# Patient Record
Sex: Male | Born: 1990 | Race: White | Hispanic: No | Marital: Married | Smoking: Current some day smoker
Health system: Southern US, Community
[De-identification: ages and names within clinical notes are randomized; demographics above are authoritative.]

## PROBLEM LIST (undated history)

## (undated) DIAGNOSIS — G8929 Other chronic pain: Secondary | ICD-10-CM

## (undated) DIAGNOSIS — I10 Essential (primary) hypertension: Secondary | ICD-10-CM

## (undated) DIAGNOSIS — M549 Dorsalgia, unspecified: Secondary | ICD-10-CM

## (undated) HISTORY — PX: WISDOM TOOTH EXTRACTION: SHX21

---
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
## 2015-08-30 ENCOUNTER — Encounter (HOSPITAL_COMMUNITY): Payer: Self-pay | Admitting: Emergency Medicine

## 2015-08-30 ENCOUNTER — Emergency Department (HOSPITAL_COMMUNITY)
Admission: EM | Admit: 2015-08-30 | Discharge: 2015-08-30 | Disposition: A | Discharge status: Home / Self Care | Payer: Self-pay | Attending: Emergency Medicine | Admitting: Emergency Medicine

## 2015-08-30 DIAGNOSIS — I10 Essential (primary) hypertension: Secondary | ICD-10-CM | POA: Insufficient documentation

## 2015-08-30 DIAGNOSIS — L723 Sebaceous cyst: Secondary | ICD-10-CM | POA: Insufficient documentation

## 2015-08-30 DIAGNOSIS — F17210 Nicotine dependence, cigarettes, uncomplicated: Secondary | ICD-10-CM | POA: Insufficient documentation

## 2015-08-30 HISTORY — DX: Essential (primary) hypertension: I10

## 2015-08-30 MED ORDER — DICLOFENAC SODIUM 75 MG PO TBEC: 75.0000 mg | DELAYED_RELEASE_TABLET | Freq: Two times a day (BID) | ORAL | Status: DC

## 2015-08-30 MED ORDER — CLINDAMYCIN HCL 150 MG PO CAPS: 150.0000 mg | ORAL_CAPSULE | Freq: Four times a day (QID) | ORAL | Status: DC

## 2015-08-30 MED ORDER — CLINDAMYCIN HCL 150 MG PO CAPS
300.0000 mg | ORAL_CAPSULE | Freq: Once | ORAL | Status: AC
  Administered 2015-08-30: 300 mg via ORAL
  Filled 2015-08-30: qty 2

## 2015-08-30 MED ORDER — SULFAMETHOXAZOLE-TRIMETHOPRIM 800-160 MG PO TABS: 1.0000 | ORAL_TABLET | Freq: Two times a day (BID) | ORAL | Status: AC

## 2015-08-30 MED ORDER — KETOROLAC TROMETHAMINE 10 MG PO TABS
10.0000 mg | ORAL_TABLET | Freq: Once | ORAL | Status: AC
  Administered 2015-08-30: 10 mg via ORAL
  Filled 2015-08-30: qty 1

## 2015-08-30 NOTE — Discharge Instructions (Signed)
Epidermal Cyst PLEASE SOAK THE CYST AREA IN WARM EPSOM SALT WATER FOR 10-15 MIN. USE BACTRIM AND DICLOFENAC 2 TIMES DAILY WITH FOOD. SEE DR DAVIS IN THE OFFICE FOR EVALUATION AND MANAGEMENT OF THE INFECTED CYST.                                                                                                                                                                                                   An epidermal cyst is usually a small, painless lump under the skin. Cysts often occur on the face, neck, stomach, chest, or genitals. The cyst may be filled with a bad smelling paste. Do not pop your cyst. Popping the cyst can cause pain and puffiness (swelling). HOME CARE   Only take medicines as told by your doctor.  Take your medicine (antibiotics) as told. Finish it even if you start to feel better. GET HELP RIGHT AWAY IF:  Your cyst is tender, red, or puffy.  You are not getting better, or you are getting worse.  You have any questions or concerns. MAKE SURE YOU:  Understand these instructions.  Will watch your condition.  Will get help right away if you are not doing well or get worse.   This information is not intended to replace advice given to you by your health care provider. Make sure you discuss any questions you have with your health care provider.   Document Released: 05/10/2004 Document Revised: 10/02/2011 Document Reviewed: 10/09/2010 Elsevier Interactive Patient Education [employer]2016 Elsevier Inc.

## 2015-08-30 NOTE — ED Notes (Signed)
Onset yesterday abscess to back of neck, red, painful, swollen

## 2015-08-30 NOTE — ED Provider Notes (Signed)
CSN: [id number]     Arrival date & time 08/30/15  1254 History   First MD Initiated Contact with Patient 08/30/15 1306     Chief Complaint  Patient presents with  . Abscess     (Consider location/radiation/quality/duration/timing/severity/associated sxs/prior Treatment) Patient is a 25 y.o. male presenting with abscess. The history is provided by the patient.  Abscess Location:  Head/neck Head/neck abscess location: MID NECK. Abscess quality: painful, redness and warmth   Abscess quality: not draining and no fluctuance   Red streaking: no   Duration:  2 days Progression:  Worsening Pain details:    Quality:  Sharp   Severity:  Moderate   Timing:  Intermittent   Progression:  Worsening Chronicity:  New Context: not diabetes and not immunosuppression   Relieved by:  Nothing Exacerbated by: palpation. Ineffective treatments:  None tried Associated symptoms: no anorexia, no fever, no nausea and no vomiting   Risk factors: no hx of MRSA and no prior abscess     Past Medical History  Diagnosis Date  . Hypertension    History reviewed. No pertinent past surgical history. No family history on file. Social History  Substance Use Topics  . Smoking status: Current Every Day Smoker -- 1.00 packs/day    Types: Cigarettes  . Smokeless tobacco: None  . Alcohol Use: None    Review of Systems  Constitutional: Negative for fever and activity change.       All ROS Neg except as noted in HPI  HENT: Negative for nosebleeds.   Eyes: Negative for photophobia and discharge.  Respiratory: Negative for cough, shortness of breath and wheezing.   Cardiovascular: Negative for chest pain and palpitations.  Gastrointestinal: Negative for nausea, vomiting, abdominal pain, blood in stool and anorexia.  Genitourinary: Negative for dysuria, frequency and hematuria.  Musculoskeletal: Negative for back pain, arthralgias and neck pain.  Skin: Negative.        Abscess  Neurological: Negative for  dizziness, seizures and speech difficulty.  Psychiatric/Behavioral: Negative for hallucinations and confusion.      Allergies  Review of patient's allergies indicates not on file.  Home Medications   Prior to Admission medications   Not on File   BP 148/67 mmHg  Pulse 88  Temp(Src) 98.8 F (37.1 C) (Oral)  Resp 20  Ht  (1.702 m)  Wt 79.379 kg  BMI 27.40 kg/m2  SpO2 100% Physical Exam  Constitutional: He is oriented to person, place, and time. He appears well-developed and well-nourished.  Non-toxic appearance.  HENT:  Head: Normocephalic.  Right Ear: Tympanic membrane and external ear normal.  Left Ear: Tympanic membrane and external ear normal.  Eyes: EOM and lids are normal. Pupils are equal, round, and reactive to light.  Neck: Normal range of motion. Neck supple. Carotid bruit is not present.    Cardiovascular: Normal rate, regular rhythm, normal heart sounds, intact distal pulses and normal pulses.   Pulmonary/Chest: Breath sounds normal. No respiratory distress.  Abdominal: Soft. Bowel sounds are normal. There is no tenderness. There is no guarding.  Musculoskeletal: Normal range of motion.  Lymphadenopathy:       Head (right side): No submandibular adenopathy present.       Head (left side): No submandibular adenopathy present.    He has no cervical adenopathy.  Neurological: He is alert and oriented to person, place, and time. He has normal strength. No cranial nerve deficit or sensory deficit.  Skin: Skin is warm and dry.  Psychiatric: He has  a normal mood and affect. His speech is normal.  Nursing note and vitals reviewed.   ED Course  Procedures (including critical care time) Labs Review Labs Reviewed - No data to display  Imaging Review No results found. I have personally reviewed and evaluated these images and lab results as part of my medical decision-making.   EKG Interpretation None      MDM  Patient noted to have a sebaceous cyst of  the posterior neck. No red streaks appreciated. No drainage noted.  I discussed the cyst with the patient in terms which he understands. I explained to him that this would be better served by our surgical colleagues, as we do not have all the equipment needed in the emergency room for this particular procedure. The patient will be treated with Bactrim and diclofenac 2 times daily. I've asked him to use warm tub soaks, and he is referred to Dr. Earlene Plateravis for surgical evaluation.    Final diagnoses:  Sebaceous cyst    **I have reviewed nursing notes, vital signs, and all appropriate lab and imaging results for this patient.Ivery Quale*    Tanji Storrs, PA-C 08/30/15 1409  Vanetta MuldersScott Zackowski, MD 08/30/15 [phone]

## 2016-01-19 ENCOUNTER — Encounter (HOSPITAL_COMMUNITY): Payer: Self-pay

## 2016-01-19 ENCOUNTER — Emergency Department (HOSPITAL_COMMUNITY)
Admission: EM | Admit: 2016-01-19 | Discharge: 2016-01-19 | Disposition: A | Discharge status: Home / Self Care | Payer: Medicaid Other | Attending: Emergency Medicine | Admitting: Emergency Medicine

## 2016-01-19 DIAGNOSIS — I10 Essential (primary) hypertension: Secondary | ICD-10-CM | POA: Insufficient documentation

## 2016-01-19 DIAGNOSIS — M546 Pain in thoracic spine: Secondary | ICD-10-CM | POA: Insufficient documentation

## 2016-01-19 DIAGNOSIS — M545 Low back pain: Secondary | ICD-10-CM | POA: Diagnosis present

## 2016-01-19 DIAGNOSIS — F17210 Nicotine dependence, cigarettes, uncomplicated: Secondary | ICD-10-CM | POA: Insufficient documentation

## 2016-01-19 DIAGNOSIS — Z79899 Other long term (current) drug therapy: Secondary | ICD-10-CM | POA: Diagnosis not present

## 2016-01-19 MED ORDER — METAXALONE 400 MG PO TABS: 400.0000 mg | ORAL_TABLET | Freq: Three times a day (TID) | ORAL | 0 refills | Status: DC | PRN

## 2016-01-19 MED ORDER — TRAMADOL HCL 50 MG PO TABS: 50.0000 mg | ORAL_TABLET | Freq: Four times a day (QID) | ORAL | 0 refills | Status: DC | PRN

## 2016-01-19 MED ORDER — METAXALONE 800 MG PO TABS
800.0000 mg | ORAL_TABLET | Freq: Once | ORAL | Status: AC
  Administered 2016-01-19: 800 mg via ORAL
  Filled 2016-01-19: qty 1

## 2016-01-19 MED ORDER — KETOROLAC TROMETHAMINE 30 MG/ML IJ SOLN
30.0000 mg | Freq: Once | INTRAMUSCULAR | Status: AC
  Administered 2016-01-19: 30 mg via INTRAMUSCULAR
  Filled 2016-01-19: qty 1

## 2016-01-19 NOTE — ED Triage Notes (Signed)
Pt reports left flank pain x 3 days.  Denies injury, denies urinary symptoms.

## 2016-01-19 NOTE — ED Provider Notes (Signed)
[facility] Provider Note   CSN: [id number] Arrival date & time: 01/19/16  1627     History   Chief Complaint Chief Complaint  Patient presents with  . Flank Pain    HPI Kent Carter is a 25 y.o. male.  HPI Patient presents with mid to lower back pain for last few days. No dysuria. Worse with movements. No hematuria. No frequency. No injury. He has not had fevers. Pain is dull and worse with movements. No abdominal pain. He has not had pains like this before. Past Medical History:  Diagnosis Date  . Hypertension     There are no active problems to display for this patient.   History reviewed. No pertinent surgical history.     Home Medications    Prior to Admission medications   Medication Sig Start Date End Date Taking? Authorizing Provider  diclofenac (VOLTAREN) 75 MG EC tablet Take 1 tablet (75 mg total) by mouth 2 (two) times daily. 08/30/15   Ivery Quale, PA-C  metaxalone (SKELAXIN) 400 MG tablet Take 1 tablet (400 mg total) by mouth 3 (three) times daily as needed for muscle spasms. 01/19/16   Benjiman Core, MD  traMADol (ULTRAM) 50 MG tablet Take 1 tablet (50 mg total) by mouth every 6 (six) hours as needed. 01/19/16   Benjiman Core, MD    Family History No family history on file.  Social History Social History  Substance Use Topics  . Smoking status: Current Every Day Smoker    Packs/day: 1.00    Types: Cigarettes  . Smokeless tobacco: Never Used  . Alcohol use No     Allergies   Review of patient's allergies indicates not on file.   Review of Systems Review of Systems  Constitutional: Negative for appetite change.  Respiratory: Negative for cough.   Gastrointestinal: Negative for abdominal distention and constipation.  Endocrine: Negative for polyuria.  Genitourinary: Negative for flank pain and penile pain.  Musculoskeletal: Positive for myalgias.  Skin: Negative for rash.     Physical Exam Updated Vital Signs BP  158/87   Pulse 82   Temp 98.2 F (36.8 C)   Resp 18   Ht 5\' 7"  (1.702 m)   Wt 180 lb (81.6 kg)   SpO2 100%   BMI 28.19 kg/m   Physical Exam  Constitutional: He appears well-developed.  HENT:  Head: Atraumatic.  Eyes: EOM are normal.  Cardiovascular: Normal rate.   Pulmonary/Chest: Effort normal.  Abdominal: There is no tenderness.  Genitourinary:  Genitourinary Comments: No CVA tenderness.  Musculoskeletal:  No deformity.  Neurological: He is alert.  Skin: Skin is warm. Capillary refill takes less than 2 seconds.  Psychiatric: He has a normal mood and affect.     ED Treatments / Results  Labs (all labs ordered are listed, but only abnormal results are displayed) Labs Reviewed - No data to display  EKG  EKG Interpretation None       Radiology No results found.  Procedures Procedures (including critical care time)  Medications Ordered in ED Medications  ketorolac (TORADOL) 30 MG/ML injection 30 mg (30 mg Intramuscular Given 01/19/16 1705)  metaxalone (SKELAXIN) tablet 800 mg (800 mg Oral Given 01/19/16 1705)     Initial Impression / Assessment and Plan / ED Course  I have reviewed the triage vital signs and the nursing notes.  Pertinent labs & imaging results that were available during my care of the patient were reviewed by me and considered in my medical decision making (  see chart for details).  Clinical Course    Patient with back pain. Likely musculoskeletal. No abdominal tenderness. No dysuria. Pain is on the midline and renal disorder thought less likely. We'll treat with pain medicines and muscle relaxers and a follow-up as needed.  Final Clinical Impressions(s) / ED Diagnoses   Final diagnoses:  Acute midline thoracic back pain    New Prescriptions New Prescriptions   METAXALONE (SKELAXIN) 400 MG TABLET    Take 1 tablet (400 mg total) by mouth 3 (three) times daily as needed for muscle spasms.   TRAMADOL (ULTRAM) 50 MG TABLET    Take 1  tablet (50 mg total) by mouth every 6 (six) hours as needed.     Benjiman CoreNathan Nazar Kuan, MD 01/19/16 [id number]

## 2016-02-22 ENCOUNTER — Encounter (HOSPITAL_COMMUNITY): Payer: Self-pay | Admitting: Emergency Medicine

## 2016-02-22 ENCOUNTER — Emergency Department (HOSPITAL_COMMUNITY)
Admission: EM | Admit: 2016-02-22 | Discharge: 2016-02-22 | Disposition: A | Discharge status: Home / Self Care | Payer: Medicaid Other | Attending: Emergency Medicine | Admitting: Emergency Medicine

## 2016-02-22 DIAGNOSIS — R112 Nausea with vomiting, unspecified: Secondary | ICD-10-CM | POA: Diagnosis not present

## 2016-02-22 DIAGNOSIS — F17210 Nicotine dependence, cigarettes, uncomplicated: Secondary | ICD-10-CM | POA: Diagnosis not present

## 2016-02-22 DIAGNOSIS — Z791 Long term (current) use of non-steroidal anti-inflammatories (NSAID): Secondary | ICD-10-CM | POA: Diagnosis not present

## 2016-02-22 DIAGNOSIS — I10 Essential (primary) hypertension: Secondary | ICD-10-CM | POA: Diagnosis not present

## 2016-02-22 DIAGNOSIS — B349 Viral infection, unspecified: Secondary | ICD-10-CM

## 2016-02-22 DIAGNOSIS — J029 Acute pharyngitis, unspecified: Secondary | ICD-10-CM | POA: Insufficient documentation

## 2016-02-22 DIAGNOSIS — Z79899 Other long term (current) drug therapy: Secondary | ICD-10-CM | POA: Insufficient documentation

## 2016-02-22 LAB — CBC WITH DIFFERENTIAL/PLATELET
BASOS ABS: 0 10*3/uL (ref 0.0–0.1)
BASOS PCT: 0 %
EOS ABS: 0.1 10*3/uL (ref 0.0–0.7)
EOS PCT: 1 %
HCT: 47.8 % (ref 39.0–52.0)
Hemoglobin: 16.8 g/dL (ref 13.0–17.0)
Lymphocytes Relative: 15 %
Lymphs Abs: 1.6 10*3/uL (ref 0.7–4.0)
MCH: 31.2 pg (ref 26.0–34.0)
MCHC: 35.1 g/dL (ref 30.0–36.0)
MCV: 88.8 fL (ref 78.0–100.0)
MONO ABS: 0.6 10*3/uL (ref 0.1–1.0)
Monocytes Relative: 6 %
Neutro Abs: 8 10*3/uL — ABNORMAL HIGH (ref 1.7–7.7)
Neutrophils Relative %: 78 %
PLATELETS: 193 10*3/uL (ref 150–400)
RBC: 5.38 MIL/uL (ref 4.22–5.81)
RDW: 11.9 % (ref 11.5–15.5)
WBC: 10.4 10*3/uL (ref 4.0–10.5)

## 2016-02-22 LAB — BASIC METABOLIC PANEL
ANION GAP: 6 (ref 5–15)
BUN: 16 mg/dL (ref 6–20)
CALCIUM: 9.3 mg/dL (ref 8.9–10.3)
CO2: 27 mmol/L (ref 22–32)
Chloride: 103 mmol/L (ref 101–111)
Creatinine, Ser: 1.16 mg/dL (ref 0.61–1.24)
GFR calc Af Amer: >60 mL/min (ref >60)
GLUCOSE: 104 mg/dL — AB (ref 65–99)
POTASSIUM: 3.7 mmol/L (ref 3.5–5.1)
SODIUM: 136 mmol/L (ref 135–145)

## 2016-02-22 LAB — CBG MONITORING, ED: Glucose-Capillary: 94 mg/dL (ref 65–99)

## 2016-02-22 LAB — RAPID STREP SCREEN (MED CTR MEBANE ONLY): STREPTOCOCCUS, GROUP A SCREEN (DIRECT): NEGATIVE

## 2016-02-22 MED ORDER — ONDANSETRON HCL 4 MG PO TABS
4.0000 mg | ORAL_TABLET | Freq: Once | ORAL | Status: AC
Start: 2016-02-22 — End: 2016-02-22
  Administered 2016-02-22: 4 mg via ORAL
  Filled 2016-02-22: qty 1

## 2016-02-22 MED ORDER — ONDANSETRON 4 MG PO TBDP: 4.0000 mg | ORAL_TABLET | Freq: Once | ORAL | Status: DC

## 2016-02-22 MED ORDER — ONDANSETRON 4 MG PO TBDP: ORAL_TABLET | ORAL | 0 refills | Status: DC

## 2016-02-22 NOTE — ED Provider Notes (Signed)
[facility] Provider Note   CSN: [id number] Arrival date & time: 02/22/16  [id number]   By signing my name below, I, Clovis PuAvnee Patel, attest that this documentation has been prepared under the direction and in the presence of Bethann BerkshireJoseph Dnasia Gauna, MD  Electronically Signed: Clovis PuAvnee Patel, ED Scribe. 02/22/16. 11:21 AM.   History   Chief Complaint Chief Complaint  Patient presents with  . Emesis   The history is provided by the patient. No language interpreter was used.  Emesis   This is a new problem. The current episode started yesterday. The problem occurs 2 to 4 times per day. The problem has not changed since onset.There has been no fever. Pertinent negatives include no abdominal pain, no cough, no diarrhea, no fever and no headaches.   HPI Comments:  Kent Carter is a 25 y.o. male who presents to the Emergency Department complaining of sudden onset, mild sore throat x 1 day. Pt reports associated nausea and 3 episodes of emesis. Pt denies diarrhea and sick contacts. No alleviating or exacerbating factors noted. Pt denies any other symptoms or complaints at this time.  Past Medical History:  Diagnosis Date  . Hypertension     There are no active problems to display for this patient.   History reviewed. No pertinent surgical history.  Home Medications    Prior to Admission medications   Medication Sig Start Date End Date Taking? Authorizing Provider  diclofenac (VOLTAREN) 75 MG EC tablet Take 1 tablet (75 mg total) by mouth 2 (two) times daily. 08/30/15   Ivery QualeHobson Bryant, PA-C  metaxalone (SKELAXIN) 400 MG tablet Take 1 tablet (400 mg total) by mouth 3 (three) times daily as needed. 01/19/16   Benjiman CoreNathan Pickering, MD  traMADol (ULTRAM) 50 MG tablet Take 1 tablet (50 mg total) by mouth every 6 (six) hours as needed. 01/19/16   Benjiman CoreNathan Pickering, MD    Family History History reviewed. No pertinent family history.  Social History Social History  Substance Use Topics  . Smoking  status: Current Every Day Smoker    Packs/day: 1.00    Types: Cigarettes  . Smokeless tobacco: Never Used  . Alcohol use No     Allergies   Patient has no allergy information on record.   Review of Systems Review of Systems  Constitutional: Negative for appetite change, fatigue and fever.  HENT: Positive for sore throat. Negative for congestion, ear discharge and sinus pressure.   Eyes: Negative for discharge.  Respiratory: Negative for cough.   Cardiovascular: Negative for chest pain.  Gastrointestinal: Positive for nausea and vomiting. Negative for abdominal pain and diarrhea.  Genitourinary: Negative for frequency and hematuria.  Musculoskeletal: Negative for back pain.  Skin: Negative for rash.  Neurological: Negative for seizures and headaches.  Psychiatric/Behavioral: Negative for hallucinations.     Physical Exam Updated Vital Signs BP 146/94 (BP Location: Left Arm)   Pulse 88   Temp 98.2 F (36.8 C) (Oral)   Resp 18   Ht 5\' 7"  (1.702 m)   Wt 180 lb (81.6 kg)   SpO2 100%   BMI 28.19 kg/m   Physical Exam  Constitutional: He is oriented to person, place, and time. He appears well-developed.  HENT:  Head: Normocephalic.  Mouth/Throat: Posterior oropharyngeal edema (minimal) present.  Eyes: Conjunctivae and EOM are normal. No scleral icterus.  Neck: Neck supple. No thyromegaly present.  Cardiovascular: Normal rate and regular rhythm.  Exam reveals no gallop and no friction rub.   No murmur heard. Pulmonary/Chest: No  stridor. He has no wheezes. He has no rales. He exhibits no tenderness.  Abdominal: He exhibits no distension. There is no tenderness. There is no rebound.  Musculoskeletal: Normal range of motion. He exhibits no edema.  Lymphadenopathy:    He has no cervical adenopathy.  Neurological: He is oriented to person, place, and time. He exhibits normal muscle tone. Coordination normal.  Skin: No rash noted. No erythema.  Psychiatric: He has a normal  mood and affect. His behavior is normal.  Nursing note and vitals reviewed.    ED Treatments / Results  DIAGNOSTIC STUDIES:  Oxygen Saturation is 100% on RA, normal by my interpretation.    COORDINATION OF CARE:  11:19 AM Discussed treatment plan with pt at bedside and pt agreed to plan.  Labs (all labs ordered are listed, but only abnormal results are displayed) Labs Reviewed  CBC WITH DIFFERENTIAL/PLATELET  BASIC METABOLIC PANEL    EKG  EKG Interpretation None       Radiology No results found.  Procedures Procedures (including critical care time)  Medications Ordered in ED Medications  ondansetron (ZOFRAN) tablet 4 mg (4 mg Oral Given 02/22/16 1110)     Initial Impression / Assessment and Plan / ED Course  I have reviewed the triage vital signs and the nursing notes.  Pertinent labs & imaging results that were available during my care of the patient were reviewed by me and considered in my medical decision making (see chart for details).  Clinical Course     Diagnosis viral pharyngitis. Mild dehydration. Patient is sent home with Zofran prescription and will follow-up as needed. He should drink plenty of fluids Tylenol or Motrin for discomfort  Final Clinical Impressions(s) / ED Diagnoses   Final diagnoses:  None    New Prescriptions New Prescriptions   No medications on file  The chart was scribed for me under my direct supervision.  I personally performed the history, physical, and medical decision making and all procedures in the evaluation of this patient.Bethann Berkshire.     Jerol Rufener, MD 02/22/16 1230

## 2016-02-22 NOTE — ED Triage Notes (Signed)
Pt reports emesis,sore throat since last night. Pt denies abd pain,fever,diarrhea.nad noted.

## 2016-02-22 NOTE — Discharge Instructions (Signed)
Follow-up if not improving. Drink plenty of fluids. Take Tylenol or Motrin for fever or aches

## 2016-02-25 LAB — CULTURE, GROUP A STREP (THRC)

## 2016-06-22 ENCOUNTER — Other Ambulatory Visit (HOSPITAL_COMMUNITY): Payer: Self-pay | Admitting: Family Medicine

## 2016-06-22 DIAGNOSIS — M545 Low back pain: Secondary | ICD-10-CM

## 2016-06-29 ENCOUNTER — Ambulatory Visit (HOSPITAL_COMMUNITY)
Admission: RE | Admit: 2016-06-29 | Discharge: 2016-06-29 | Disposition: A | Discharge status: Home / Self Care | Payer: Medicaid Other | Source: Ambulatory Visit | Attending: Family Medicine | Admitting: Family Medicine

## 2016-06-29 DIAGNOSIS — M5126 Other intervertebral disc displacement, lumbar region: Secondary | ICD-10-CM | POA: Diagnosis not present

## 2016-06-29 DIAGNOSIS — M545 Low back pain: Secondary | ICD-10-CM | POA: Diagnosis present

## 2016-07-06 ENCOUNTER — Encounter (HOSPITAL_COMMUNITY): Payer: Self-pay | Admitting: Emergency Medicine

## 2016-07-06 ENCOUNTER — Emergency Department (HOSPITAL_COMMUNITY)
Admission: EM | Admit: 2016-07-06 | Discharge: 2016-07-06 | Disposition: A | Discharge status: Home / Self Care | Payer: Medicaid Other | Attending: Emergency Medicine | Admitting: Emergency Medicine

## 2016-07-06 DIAGNOSIS — F17210 Nicotine dependence, cigarettes, uncomplicated: Secondary | ICD-10-CM | POA: Diagnosis not present

## 2016-07-06 DIAGNOSIS — I10 Essential (primary) hypertension: Secondary | ICD-10-CM | POA: Diagnosis not present

## 2016-07-06 DIAGNOSIS — Z79899 Other long term (current) drug therapy: Secondary | ICD-10-CM | POA: Diagnosis not present

## 2016-07-06 DIAGNOSIS — M62830 Muscle spasm of back: Secondary | ICD-10-CM | POA: Diagnosis not present

## 2016-07-06 DIAGNOSIS — M5136 Other intervertebral disc degeneration, lumbar region: Secondary | ICD-10-CM | POA: Diagnosis not present

## 2016-07-06 DIAGNOSIS — M549 Dorsalgia, unspecified: Secondary | ICD-10-CM | POA: Diagnosis present

## 2016-07-06 HISTORY — DX: Dorsalgia, unspecified: M54.9

## 2016-07-06 HISTORY — DX: Other chronic pain: G89.29

## 2016-07-06 MED ORDER — DEXAMETHASONE SODIUM PHOSPHATE 4 MG/ML IJ SOLN
8.0000 mg | Freq: Once | INTRAMUSCULAR | Status: AC
  Administered 2016-07-06: 8 mg via INTRAMUSCULAR
  Filled 2016-07-06: qty 2

## 2016-07-06 MED ORDER — CYCLOBENZAPRINE HCL 10 MG PO TABS: 10.0000 mg | ORAL_TABLET | Freq: Three times a day (TID) | ORAL | 0 refills | Status: DC

## 2016-07-06 MED ORDER — DICLOFENAC SODIUM 75 MG PO TBEC: 75.0000 mg | DELAYED_RELEASE_TABLET | Freq: Two times a day (BID) | ORAL | 0 refills | Status: DC

## 2016-07-06 MED ORDER — DIAZEPAM 5 MG PO TABS
10.0000 mg | ORAL_TABLET | Freq: Once | ORAL | Status: AC
  Administered 2016-07-06: 10 mg via ORAL
  Filled 2016-07-06: qty 2

## 2016-07-06 MED ORDER — KETOROLAC TROMETHAMINE 60 MG/2ML IM SOLN
60.0000 mg | Freq: Once | INTRAMUSCULAR | Status: AC
  Administered 2016-07-06: 60 mg via INTRAMUSCULAR
  Filled 2016-07-06: qty 2

## 2016-07-06 MED ORDER — DEXAMETHASONE 4 MG PO TABS: 4.0000 mg | ORAL_TABLET | Freq: Two times a day (BID) | ORAL | 0 refills | Status: DC

## 2016-07-06 NOTE — ED Triage Notes (Signed)
Chronic low back pain, has appointment with ortho dr x2 weeks.  Wants something to help with pain, states its unbearable

## 2016-07-06 NOTE — Discharge Instructions (Signed)
Vital signs within normal limits. Your examination is negative for acute neurologic deficit at this time. You have a good deal of spasm of your lower back. Please rest your back is much as possible. Use a heating pad to your back when sitting or resting. Use Flexeril 3 times daily for spasm pain. This medication may cause drowsiness, please do not drive, drink alcohol, operate machinery, or participate in activities requiring concentration when taking this medication. Use Decadron and diclofenac 2 times daily with food. Please see Kent Carter next week to discuss management of your breakthrough pains.

## 2016-07-06 NOTE — ED Provider Notes (Signed)
[facility] Provider Note   CSN: [id number] Arrival date & time: 07/06/16  1740     History   Chief Complaint Chief Complaint  Patient presents with  . Back Pain    HPI Kent Carter is a 26 y.o. male.  Patient is a 26 year old male who presents to the emergency department with a complaint of back pain, left greater than right.  The patient states he has had problems with his back for several months. He recently had an MRI of his back that revealed bulging disks, but no other significant problem. The patient states that he is accustomed to having some pain, but recently he is felt as though he could not get out of bed. The pain is interfering with his activities of daily living. He has an appointment with an orthopedic specialist in 2 weeks, but states he could not stand the pain until that time. He has not had any loss of bowel or bladder function. He's not had any repeated falls. His been no loss of sensation on the inner aspect of the thighs, or in the private areas. He's not had any recent injury or trauma to the lower back.      Past Medical History:  Diagnosis Date  . Chronic back pain   . Hypertension     There are no active problems to display for this patient.   History reviewed. No pertinent surgical history.     Home Medications    Prior to Admission medications   Medication Sig Start Date End Date Taking? Authorizing Provider  cyclobenzaprine (FLEXERIL) 10 MG tablet Take 1 tablet (10 mg total) by mouth 3 (three) times daily. 07/06/16   Ivery Quale, PA-C  dexamethasone (DECADRON) 4 MG tablet Take 1 tablet (4 mg total) by mouth 2 (two) times daily with a meal. 07/06/16   Ivery Quale, PA-C  diclofenac (VOLTAREN) 75 MG EC tablet Take 1 tablet (75 mg total) by mouth 2 (two) times daily. 07/06/16   Ivery Quale, PA-C  metaxalone (SKELAXIN) 400 MG tablet Take 1 tablet (400 mg total) by mouth 3 (three) times daily as needed. 01/19/16   Benjiman Core, MD   ondansetron (ZOFRAN ODT) 4 MG disintegrating tablet 4mg  ODT q4 hours prn nausea/vomit 02/22/16   Bethann Berkshire, MD  traMADol (ULTRAM) 50 MG tablet Take 1 tablet (50 mg total) by mouth every 6 (six) hours as needed. 01/19/16   Benjiman Core, MD    Family History No family history on file.  Social History Social History  Substance Use Topics  . Smoking status: Current Every Day Smoker    Packs/day: 0.50    Types: Cigarettes  . Smokeless tobacco: Never Used  . Alcohol use No     Allergies   Patient has no allergy information on record.   Review of Systems Review of Systems  Musculoskeletal: Positive for back pain.  All other systems reviewed and are negative.    Physical Exam Updated Vital Signs BP (!) 158/78 (BP Location: Right Arm)   Pulse 73   Temp 98.3 F (36.8 C) (Oral)   Resp 18   Ht 5\' 7"  (1.702 m)   Wt 80.7 kg   SpO2 98%   BMI 27.88 kg/m   Physical Exam  Constitutional: He is oriented to person, place, and time. He appears well-developed and well-nourished.  Non-toxic appearance.  HENT:  Head: Normocephalic.  Right Ear: Tympanic membrane and external ear normal.  Left Ear: Tympanic membrane and external ear normal.  Eyes:  EOM and lids are normal. Pupils are equal, round, and reactive to light.  Neck: Normal range of motion. Neck supple. Carotid bruit is not present.  Cardiovascular: Normal rate, regular rhythm, normal heart sounds, intact distal pulses and normal pulses.   Pulmonary/Chest: Breath sounds normal. No respiratory distress.  Abdominal: Soft. Bowel sounds are normal. There is no tenderness. There is no guarding.  Musculoskeletal: Normal range of motion.       Lumbar back: He exhibits pain and spasm.  Lymphadenopathy:       Head (right side): No submandibular adenopathy present.       Head (left side): No submandibular adenopathy present.    He has no cervical adenopathy.  Neurological: He is alert and oriented to person, place, and time.  He has normal strength. No cranial nerve deficit or sensory deficit. Coordination and gait normal.  No gross neurologic deficits appreciated. Gait is steady and intact.  Skin: Skin is warm and dry.  Psychiatric: He has a normal mood and affect. His speech is normal.  Nursing note and vitals reviewed.    ED Treatments / Results  Labs (all labs ordered are listed, but only abnormal results are displayed) Labs Reviewed - No data to display  EKG  EKG Interpretation None       Radiology No results found.  Procedures Procedures (including critical care time)  Medications Ordered in ED Medications  diazepam (VALIUM) tablet 10 mg (10 mg Oral Given 07/06/16 1856)  ketorolac (TORADOL) injection 60 mg (60 mg Intramuscular Given 07/06/16 1857)  dexamethasone (DECADRON) injection 8 mg (8 mg Intramuscular Given 07/06/16 1859)     Initial Impression / Assessment and Plan / ED Course  I have reviewed the triage vital signs and the nursing notes.  Pertinent labs & imaging results that were available during my care of the patient were reviewed by me and considered in my medical decision making (see chart for details).     *I have reviewed nursing notes, vital signs, and all appropriate lab and imaging results for this patient.  Final Clinical Impressions(s) / ED Diagnoses MDM Vital signs within normal limits. There no gross neurologic deficits appreciated on examination at this time. I can reproduce the pain with range of motion exercises involving the lumbar area. The patient has a great deal of spasm on the left compared to the right.  I reviewed the MR of the lumbar spine from March 16.  I discussed the findings of today's examination with the patient in terms which he understands. The patient will be treated with Flexeril, Decadron, and diclofenac. I've asked him to rest his back, as well as to use heat to his back when resting. The patient is to follow-up with Mr. Leavy CellaBoyd, his primary  care provider next week to discuss how to handle breakthrough pain until he is seen by the orthopedic specialist.   Final diagnoses:  Muscle spasm of back  DDD (degenerative disc disease), lumbar    New Prescriptions New Prescriptions   CYCLOBENZAPRINE (FLEXERIL) 10 MG TABLET    Take 1 tablet (10 mg total) by mouth 3 (three) times daily.   DEXAMETHASONE (DECADRON) 4 MG TABLET    Take 1 tablet (4 mg total) by mouth 2 (two) times daily with a meal.   DICLOFENAC (VOLTAREN) 75 MG EC TABLET    Take 1 tablet (75 mg total) by mouth 2 (two) times daily.     Ivery QualeHobson Larisa Lanius, PA-C 07/06/16 1927    Bethann BerkshireJoseph Zammit, MD 07/06/16  2354  

## 2016-07-06 NOTE — ED Triage Notes (Signed)
Pt reports recent MRI with disc problem- cannot see his physician for 2 weeks per his report Reports back pain Followed by Dr Arvella NighScott Eden, [place]

## 2016-10-02 ENCOUNTER — Emergency Department (HOSPITAL_COMMUNITY): Payer: Medicaid Other

## 2016-10-02 ENCOUNTER — Encounter (HOSPITAL_COMMUNITY): Payer: Self-pay

## 2016-10-02 ENCOUNTER — Emergency Department (HOSPITAL_COMMUNITY)
Admission: EM | Admit: 2016-10-02 | Discharge: 2016-10-02 | Discharge status: Against Medical Advice | Payer: Medicaid Other | Attending: Emergency Medicine | Admitting: Emergency Medicine

## 2016-10-02 DIAGNOSIS — S99921A Unspecified injury of right foot, initial encounter: Secondary | ICD-10-CM | POA: Insufficient documentation

## 2016-10-02 DIAGNOSIS — Y999 Unspecified external cause status: Secondary | ICD-10-CM | POA: Diagnosis not present

## 2016-10-02 DIAGNOSIS — W208XXA Other cause of strike by thrown, projected or falling object, initial encounter: Secondary | ICD-10-CM | POA: Diagnosis not present

## 2016-10-02 DIAGNOSIS — Y9389 Activity, other specified: Secondary | ICD-10-CM | POA: Diagnosis not present

## 2016-10-02 DIAGNOSIS — Y92009 Unspecified place in unspecified non-institutional (private) residence as the place of occurrence of the external cause: Secondary | ICD-10-CM | POA: Diagnosis not present

## 2016-10-02 DIAGNOSIS — F17210 Nicotine dependence, cigarettes, uncomplicated: Secondary | ICD-10-CM | POA: Insufficient documentation

## 2016-10-02 DIAGNOSIS — I10 Essential (primary) hypertension: Secondary | ICD-10-CM | POA: Insufficient documentation

## 2016-10-02 NOTE — ED Triage Notes (Signed)
Reports dropping a cinder block in right block today. Swelling and bruising noted to right foot.

## 2016-10-02 NOTE — ED Provider Notes (Signed)
[facility]-EMERGENCY DEPT Provider Note   CSN: [id number] Arrival date & time: 10/02/16  1851     History   Chief Complaint Chief Complaint  Patient presents with  . Foot Injury    HPI Kent Carter is a 26 y.o. male.  HPI  Patient presents to ED for right foot pain that occurred after dropping a cinder block approximately 2 hours prior to arrival. He states that he was working at home when he accidentally dropped a block. He states since then he has been having pain on the dorsum of his foot on the lateral side as well as pain with walking. He states that he hasn't able to walk since then. He denies any previous injury or procedure done on this foot. He denies any numbness, changes in sensation, temperature changes or additional injury. He denies any head injury or loss of consciousness. Denies any chest pain, trouble breathing, nausea, vomiting, vision changes or headache.  Past Medical History:  Diagnosis Date  . Chronic back pain   . Hypertension     There are no active problems to display for this patient.   History reviewed. No pertinent surgical history.     Home Medications    Prior to Admission medications   Medication Sig Start Date End Date Taking? Authorizing Provider  cyclobenzaprine (FLEXERIL) 10 MG tablet Take 1 tablet (10 mg total) by mouth 3 (three) times daily. 07/06/16   Ivery Quale, PA-C  dexamethasone (DECADRON) 4 MG tablet Take 1 tablet (4 mg total) by mouth 2 (two) times daily with a meal. 07/06/16   Ivery Quale, PA-C  diclofenac (VOLTAREN) 75 MG EC tablet Take 1 tablet (75 mg total) by mouth 2 (two) times daily. 07/06/16   Ivery Quale, PA-C  metaxalone (SKELAXIN) 400 MG tablet Take 1 tablet (400 mg total) by mouth 3 (three) times daily as needed. 01/19/16   Benjiman Core, MD  ondansetron (ZOFRAN ODT) 4 MG disintegrating tablet 4mg  ODT q4 hours prn nausea/vomit 02/22/16   Bethann Berkshire, MD  traMADol (ULTRAM) 50 MG tablet Take 1 tablet (50 mg  total) by mouth every 6 (six) hours as needed. 01/19/16   Benjiman Core, MD    Family History No family history on file.  Social History Social History  Substance Use Topics  . Smoking status: Current Every Day Smoker    Packs/day: 0.50    Types: Cigarettes  . Smokeless tobacco: Never Used  . Alcohol use No     Allergies   Patient has no known allergies.   Review of Systems Review of Systems  Constitutional: Negative for chills and fever.  Eyes: Negative for photophobia and visual disturbance.  Respiratory: Negative for shortness of breath.   Cardiovascular: Negative for chest pain.  Gastrointestinal: Negative for nausea and vomiting.  Musculoskeletal: Positive for arthralgias, joint swelling and myalgias.  Skin: Positive for color change. Negative for rash.  Neurological: Negative for dizziness, syncope, weakness, light-headedness and headaches.     Physical Exam Updated Vital Signs BP (!) 157/76 (BP Location: Right Arm)   Pulse 82   Temp 97.7 F (36.5 C) (Temporal)   Resp 18   Ht 5\' 4"  (1.626 m)   Wt 80.7 kg (178 lb)   SpO2 99%   BMI 30.55 kg/m   Physical Exam  Constitutional: He appears well-developed and well-nourished. No distress.  HENT:  Head: Normocephalic and atraumatic.  Eyes: Conjunctivae and EOM are normal. No scleral icterus.  Neck: Normal range of motion.  Pulmonary/Chest: Effort normal.  No respiratory distress.  Musculoskeletal: He exhibits edema and tenderness.       Feet:  Pain with movement of toes. Tenderness to palpation in the indicated areas on image. There is visible bruising and swelling noted as indicated. No ankle tenderness medially or laterally. There is some bruising present on the lateral aspect of foot but no tenderness present. 2+ DP pulses bilaterally. Sensation intact to light touch. Area appears neurovascularly intact.  Neurological: He is alert.  Skin: No rash noted. He is not diaphoretic.  Psychiatric: He has a  normal mood and affect.  Nursing note and vitals reviewed.        ED Treatments / Results  Labs (all labs ordered are listed, but only abnormal results are displayed) Labs Reviewed - No data to display  EKG  EKG Interpretation None       Radiology No results found.  Procedures Procedures (including critical care time)  Medications Ordered in ED Medications - No data to display   Initial Impression / Assessment and Plan / ED Course  I have reviewed the triage vital signs and the nursing notes.  Pertinent labs & imaging results that were available during my care of the patient were reviewed by me and considered in my medical decision making (see chart for details).     Patient presents to the ED for right foot pain that occurred after dropping a cinder block on foot prior to arrival. On exam area appears neurovascularly intact. 2+ DP pulses and normal sensation to light touch. He denies any previous injury or procedure and this foot. There is significant swelling and tenderness noted as stated above. X-ray was obtained and showed mildly displaced fracture at the distal fourth metatarsal unchanged in appearance from yesterday. Radiology tech contacted me and stated that patient [facility]. She states that the x-ray showed a fracture. Patient told me that the incident occurred today and that he did not get any x-rays done yesterday even though I printed the x-rays off and showed them to him. I told him that with his permission we can obtain Medical records from his visit yesterday and patient agreed. After a few minutes he came to me the workstation and told me that he did get x-rays of his foot, that he did not want to get the bandage put yesterday and that the accident actually occurred yesterday but the pain worsened today. He proceeded to state that he is tired of this "bulls&%$" and that he wants to see a real "motherf&%$^#@" surgeon and that we  cannot help him. Patient then proceeded to leave the facility AMA.  Final Clinical Impressions(s) / ED Diagnoses   Final diagnoses:  None    New Prescriptions New Prescriptions   No medications on file     Dietrich PatesKhatri, Marleen Moret, Cordelia Poche-C 10/02/16 2040    Mesner, Barbara CowerJason, MD 10/03/16 [phone]

## 2016-10-02 NOTE — ED Notes (Signed)
In room with Idelle LeechKhatri, [place] to speak with pt about x-rays done yesterday.  Pt states that he did not have any x-rays yesterday and [facility] as the x-ray shows.  Pt agrees to sign paper [facility].  Upon returning with form to sign for request, pt no longer in room.  PA states that pt left

## 2016-10-04 ENCOUNTER — Ambulatory Visit (INDEPENDENT_AMBULATORY_CARE_PROVIDER_SITE_OTHER): Payer: Medicaid Other | Admitting: Orthopaedic Surgery

## 2016-10-04 VITALS — BP 129/82 | HR 88 | Temp 97.1°F | Ht 66.5 in | Wt 182.0 lb

## 2016-10-04 DIAGNOSIS — S92344A Nondisplaced fracture of fourth metatarsal bone, right foot, initial encounter for closed fracture: Secondary | ICD-10-CM

## 2016-10-04 DIAGNOSIS — F17210 Nicotine dependence, cigarettes, uncomplicated: Secondary | ICD-10-CM

## 2016-10-04 MED ORDER — HYDROCODONE-ACETAMINOPHEN 7.5-325 MG PO TABS: ORAL_TABLET | ORAL | 0 refills | Status: DC

## 2016-10-04 NOTE — Patient Instructions (Signed)
Steps to Quit Smoking Smoking tobacco can be bad for your health. It can also affect almost every organ in your body. Smoking puts you and people around you at risk for many serious Jennifr Gaeta-lasting (chronic) diseases. Quitting smoking is hard, but it is one of the best things that you can do for your health. It is never too late to quit. What are the benefits of quitting smoking? When you quit smoking, you lower your risk for getting serious diseases and conditions. They can include:  Lung cancer or lung disease.  Heart disease.  Stroke.  Heart attack.  Not being able to have children (infertility).  Weak bones (osteoporosis) and broken bones (fractures).  If you have coughing, wheezing, and shortness of breath, those symptoms may get better when you quit. You may also get sick less often. If you are pregnant, quitting smoking can help to lower your chances of having a baby of low birth weight. What can I do to help me quit smoking? Talk with your doctor about what can help you quit smoking. Some things you can do (strategies) include:  Quitting smoking totally, instead of slowly cutting back how much you smoke over a period of time.  Going to in-person counseling. You are more likely to quit if you go to many counseling sessions.  Using resources and support systems, such as: ? Online chats with a counselor. ? Phone quitlines. ? Printed self-help materials. ? Support groups or group counseling. ? Text messaging programs. ? Mobile phone apps or applications.  Taking medicines. Some of these medicines may have nicotine in them. If you are pregnant or breastfeeding, do not take any medicines to quit smoking unless your doctor says it is okay. Talk with your doctor about counseling or other things that can help you.  Talk with your doctor about using more than one strategy at the same time, such as taking medicines while you are also going to in-person counseling. This can help make  quitting easier. What things can I do to make it easier to quit? Quitting smoking might feel very hard at first, but there is a lot that you can do to make it easier. Take these steps:  Talk to your family and friends. Ask them to support and encourage you.  Call phone quitlines, reach out to support groups, or work with a counselor.  Ask people who smoke to not smoke around you.  Avoid places that make you want (trigger) to smoke, such as: ? Bars. ? Parties. ? Smoke-break areas at work.  Spend time with people who do not smoke.  Lower the stress in your life. Stress can make you want to smoke. Try these things to help your stress: ? Getting regular exercise. ? Deep-breathing exercises. ? Yoga. ? Meditating. ? Doing a body scan. To do this, close your eyes, focus on one area of your body at a time from head to toe, and notice which parts of your body are tense. Try to relax the muscles in those areas.  Download or buy apps on your mobile phone or tablet that can help you stick to your quit plan. There are many free apps, such as QuitGuide from the CDC (Centers for Disease Control and Prevention). You can find more support from smokefree.gov and other websites.  This information is not intended to replace advice given to you by your health care provider. Make sure you discuss any questions you have with your health care provider. Document Released: 01/27/2009 Document   Revised: 11/29/2015 Document Reviewed: 08/17/2014 Elsevier Interactive Patient Education  2018 Elsevier Inc.  

## 2016-10-04 NOTE — Progress Notes (Signed)
Subjective:    Patient ID: Kent Carter, male    DOB: February 07, 1991, 26 y.o.   MRN: [id number]  HPI He dropped a cement block on his right foot three days ago.  He was seen in the ER at [facility] on 10-01-16.  He was evaluated.  X-rays showed a fracture of the fourth metatarsal head/neck area.  He had no other injury.    I have reviewed his ER records, x-rays and x-ray report.  He has continued working as he just started a new job and says it was hard to get and he cannot afford to be out of work.  He is using regular shoes.  He declines a post op shoe or CAM walker.  His wife and new baby girl are present.  He works in Teaching laboratory technician and receiving.  I have gone over precautions with the foot.  I have given Contrast Bath sheet of instructions.  He has just about stopped smoking.  He does a cigar now and then and uses vapes.  He is trying to stop them.   Review of Systems  HENT: Negative for congestion.   Respiratory: Negative for cough and shortness of breath.   Cardiovascular: Negative for chest pain and leg swelling.  Endocrine: Negative for cold intolerance.  Musculoskeletal: Positive for arthralgias, gait problem and joint swelling.  Allergic/Immunologic: Negative for environmental allergies.   Past Medical History:  Diagnosis Date  . Chronic back pain   . Hypertension     No past surgical history on file.  Current Outpatient Prescriptions on File Prior to Visit  Medication Sig Dispense Refill  . cyclobenzaprine (FLEXERIL) 10 MG tablet Take 1 tablet (10 mg total) by mouth 3 (three) times daily. 20 tablet 0  . dexamethasone (DECADRON) 4 MG tablet Take 1 tablet (4 mg total) by mouth 2 (two) times daily with a meal. 12 tablet 0  . diclofenac (VOLTAREN) 75 MG EC tablet Take 1 tablet (75 mg total) by mouth 2 (two) times daily. 14 tablet 0  . metaxalone (SKELAXIN) 400 MG tablet Take 1 tablet (400 mg total) by mouth 3 (three) times daily as needed. 10 tablet 0  . ondansetron  (ZOFRAN ODT) 4 MG disintegrating tablet 4mg  ODT q4 hours prn nausea/vomit 12 tablet 0  . traMADol (ULTRAM) 50 MG tablet Take 1 tablet (50 mg total) by mouth every 6 (six) hours as needed. 10 tablet 0   No current facility-administered medications on file prior to visit.     Social History   Social History  . Marital status: Married    Spouse name: N/A  . Number of children: N/A  . Years of education: N/A   Occupational History  . Not on file.   Social History Main Topics  . Smoking status: Current Every Day Smoker    Packs/day: 0.50    Types: Cigarettes  . Smokeless tobacco: Never Used  . Alcohol use No  . Drug use: No  . Sexual activity: Not on file   Other Topics Concern  . Not on file   Social History Narrative  . No narrative on file    No family history on file.  BP 129/82   Pulse 88   Temp 97.1 F (36.2 C)   Ht 5' 6.5" (1.689 m)   Wt 182 lb (82.6 kg)   BMI 28.94 kg/m       Objective:   Physical Exam  Constitutional: He is oriented to person, place, and time. He appears well-developed  and well-nourished.  HENT:  Head: Normocephalic and atraumatic.  Eyes: Conjunctivae and EOM are normal. Pupils are equal, round, and reactive to light.  Neck: Normal range of motion. Neck supple.  Cardiovascular: Normal rate, regular rhythm and intact distal pulses.   Pulmonary/Chest: Effort normal.  Abdominal: Soft.  Musculoskeletal: He exhibits tenderness (Right foot with ecchymosis of toes and lateral foot.  NV intact. Pain over fourth metatarsal head.  No redness is present.  Limp to the right.  Left foot negative.).  Neurological: He is alert and oriented to person, place, and time. He has normal reflexes. He displays normal reflexes. No cranial nerve deficit. He exhibits normal muscle tone. Coordination normal.  Skin: Skin is warm and dry.  Psychiatric: He has a normal mood and affect. His behavior is normal. Judgment and thought content normal.  Vitals  reviewed.         Assessment & Plan:   Encounter Diagnoses  Name Primary?  . Closed nondisplaced fracture of fourth metatarsal bone of right foot, initial encounter Yes  . Cigarette nicotine dependence without complication    I have instructed him on precautions.  He refuses any boot or shoe.  Return in one week.  I have reviewed the [place]  Controlled Substance Reporting System web site prior to prescribing narcotic medicine for this patient.  Call if any problem.  Precautions discussed.   Electronically Signed Darreld McleanWayne Naiyah Klostermann, MD 6/21/20183:49 PM

## 2016-10-10 ENCOUNTER — Ambulatory Visit (INDEPENDENT_AMBULATORY_CARE_PROVIDER_SITE_OTHER): Payer: Self-pay | Admitting: Orthopaedic Surgery

## 2016-10-10 ENCOUNTER — Ambulatory Visit: Payer: Medicaid Other

## 2016-10-10 ENCOUNTER — Encounter: Payer: Self-pay | Admitting: Orthopaedic Surgery

## 2016-10-10 ENCOUNTER — Ambulatory Visit (INDEPENDENT_AMBULATORY_CARE_PROVIDER_SITE_OTHER): Payer: Medicaid Other

## 2016-10-10 DIAGNOSIS — S92344D Nondisplaced fracture of fourth metatarsal bone, right foot, subsequent encounter for fracture with routine healing: Secondary | ICD-10-CM | POA: Diagnosis not present

## 2016-10-10 MED ORDER — HYDROCODONE-ACETAMINOPHEN 7.5-325 MG PO TABS: ORAL_TABLET | ORAL | 0 refills | Status: DC

## 2016-10-10 NOTE — Progress Notes (Signed)
CC:  My foot hurts more  He has been walking and working on his right foot.  I had offered a CAM walker before and he refused.  I have offered post op shoe today and he agrees.  NV intact.  X-rays were done, reported separately.  Encounter Diagnosis  Name Primary?  . Closed nondisplaced fracture of fourth metatarsal bone of right foot with routine healing, subsequent encounter Yes   Return in three weeks.  I have reviewed the [place] Controlled Substance Reporting System web site prior to prescribing narcotic medicine for this patient.  Call if any problem.  Precautions discussed.   Electronically Signed Darreld McleanWayne Gennaro Lizotte, MD 6/27/201810:07 AM

## 2016-10-11 ENCOUNTER — Ambulatory Visit: Payer: Medicaid Other | Admitting: Orthopaedic Surgery

## 2016-10-31 ENCOUNTER — Other Ambulatory Visit: Payer: Medicaid Other

## 2016-10-31 ENCOUNTER — Encounter: Payer: Medicaid Other | Admitting: Orthopaedic Surgery

## 2016-11-01 NOTE — Progress Notes (Signed)
This encounter was created in error - please disregard.

## 2017-09-02 ENCOUNTER — Emergency Department (HOSPITAL_COMMUNITY): Payer: Medicaid Other

## 2017-09-02 ENCOUNTER — Encounter (HOSPITAL_COMMUNITY): Payer: Self-pay

## 2017-09-02 ENCOUNTER — Emergency Department (HOSPITAL_COMMUNITY)
Admission: EM | Admit: 2017-09-02 | Discharge: 2017-09-03 | Disposition: A | Discharge status: Home / Self Care | Payer: Medicaid Other | Attending: Emergency Medicine | Admitting: Emergency Medicine

## 2017-09-02 ENCOUNTER — Other Ambulatory Visit: Payer: Self-pay

## 2017-09-02 DIAGNOSIS — R002 Palpitations: Secondary | ICD-10-CM

## 2017-09-02 DIAGNOSIS — R0789 Other chest pain: Secondary | ICD-10-CM | POA: Insufficient documentation

## 2017-09-02 DIAGNOSIS — Z79899 Other long term (current) drug therapy: Secondary | ICD-10-CM | POA: Insufficient documentation

## 2017-09-02 DIAGNOSIS — I10 Essential (primary) hypertension: Secondary | ICD-10-CM | POA: Diagnosis not present

## 2017-09-02 DIAGNOSIS — R030 Elevated blood-pressure reading, without diagnosis of hypertension: Secondary | ICD-10-CM

## 2017-09-02 DIAGNOSIS — F17210 Nicotine dependence, cigarettes, uncomplicated: Secondary | ICD-10-CM | POA: Diagnosis not present

## 2017-09-02 LAB — RAPID URINE DRUG SCREEN, HOSP PERFORMED
AMPHETAMINES: NOT DETECTED
Barbiturates: NOT DETECTED
Benzodiazepines: NOT DETECTED
Cocaine: NOT DETECTED
OPIATES: NOT DETECTED
TETRAHYDROCANNABINOL: NOT DETECTED

## 2017-09-02 LAB — CBC WITH DIFFERENTIAL/PLATELET
Basophils Absolute: 0 10*3/uL (ref 0.0–0.1)
Basophils Relative: 0 %
EOS ABS: 0.2 10*3/uL (ref 0.0–0.7)
EOS PCT: 2 %
HCT: 45.8 % (ref 39.0–52.0)
Hemoglobin: 15.5 g/dL (ref 13.0–17.0)
LYMPHS ABS: 2.2 10*3/uL (ref 0.7–4.0)
LYMPHS PCT: 25 %
MCH: 29.9 pg (ref 26.0–34.0)
MCHC: 33.8 g/dL (ref 30.0–36.0)
MCV: 88.4 fL (ref 78.0–100.0)
MONO ABS: 0.4 10*3/uL (ref 0.1–1.0)
MONOS PCT: 5 %
Neutro Abs: 6 10*3/uL (ref 1.7–7.7)
Neutrophils Relative %: 68 %
PLATELETS: 218 10*3/uL (ref 150–400)
RBC: 5.18 MIL/uL (ref 4.22–5.81)
RDW: 12.2 % (ref 11.5–15.5)
WBC: 8.7 10*3/uL (ref 4.0–10.5)

## 2017-09-02 LAB — BASIC METABOLIC PANEL
Anion gap: 10 (ref 5–15)
BUN: 19 mg/dL (ref 6–20)
CALCIUM: 9.1 mg/dL (ref 8.9–10.3)
CHLORIDE: 99 mmol/L — AB (ref 101–111)
CO2: 27 mmol/L (ref 22–32)
CREATININE: 1.2 mg/dL (ref 0.61–1.24)
Glucose, Bld: 123 mg/dL — ABNORMAL HIGH (ref 65–99)
Potassium: 3.4 mmol/L — ABNORMAL LOW (ref 3.5–5.1)
SODIUM: 136 mmol/L (ref 135–145)

## 2017-09-02 LAB — URINALYSIS, ROUTINE W REFLEX MICROSCOPIC
BILIRUBIN URINE: NEGATIVE
GLUCOSE, UA: NEGATIVE mg/dL
HGB URINE DIPSTICK: NEGATIVE
Ketones, ur: NEGATIVE mg/dL
Leukocytes, UA: NEGATIVE
Nitrite: NEGATIVE
PROTEIN: NEGATIVE mg/dL
Specific Gravity, Urine: 1.023 (ref 1.005–1.030)
pH: 7 (ref 5.0–8.0)

## 2017-09-02 LAB — TROPONIN I: Troponin I: <0.03 ng/mL (ref <0.03)

## 2017-09-02 LAB — MAGNESIUM: Magnesium: 1.8 mg/dL (ref 1.7–2.4)

## 2017-09-02 LAB — TSH: TSH: 1.03 u[IU]/mL (ref 0.350–4.500)

## 2017-09-02 MED ORDER — ALPRAZOLAM 0.25 MG PO TABS: 0.2500 mg | ORAL_TABLET | Freq: Three times a day (TID) | ORAL | 0 refills | Status: DC | PRN

## 2017-09-02 MED ORDER — POTASSIUM CHLORIDE CRYS ER 20 MEQ PO TBCR
40.0000 meq | EXTENDED_RELEASE_TABLET | Freq: Once | ORAL | Status: AC
  Administered 2017-09-03: 40 meq via ORAL
  Filled 2017-09-02: qty 2

## 2017-09-02 NOTE — ED Triage Notes (Signed)
Patient reports of dizziness, light headed and chest pain at times. Patient states she went to [facility] to check blood pressure and it was 190/114. Patient complains of headache at this time. Denies chest pain at this time.

## 2017-09-02 NOTE — ED Provider Notes (Addendum)
[facility] EMERGENCY DEPARTMENT Provider Note   CSN: [id number] Arrival date & time: 09/02/17  1734     History   Chief Complaint Chief Complaint  Patient presents with  . Dizziness  . Chest Pain    HPI [name] is a 27 y.o. male.  HPI Patient presents with episodic palpitations, lightheadedness and elevation in blood pressure.  States he has been back on his blood pressure medication for the past week has been consistently taking it.  He denies any visual changes.  He states during these episodes he does get some cramping of his hands bilaterally.  No lower extremity swelling or pain.  Denies stimulant use, alcohol use or illegal drugs.  Denies increased stress or previous diagnosis of anxiety. Past Medical History:  Diagnosis Date  . Chronic back pain   . Hypertension     There are no active problems to display for this patient.   History reviewed. No pertinent surgical history.      Home Medications    Prior to Admission medications   Medication Sig Start Date End Date Taking? Authorizing Provider  gabapentin (NEURONTIN) 300 MG capsule Take 600 mg by mouth 3 (three) times daily. 08/21/17  Yes [provider]  hydrochlorothiazide (HYDRODIURIL) 25 MG tablet Take 25 mg by mouth daily. 08/21/17  Yes [provider]  metoprolol succinate (TOPROL-XL) 50 MG 24 hr tablet Take 50 mg by mouth daily. 08/21/17  Yes [provider]  VRAYLAR 6 MG CAPS Take 1 capsule by mouth at bedtime. 08/20/17  Yes [provider]  ALPRAZolam (XANAX) 0.25 MG tablet Take 1 tablet (0.25 mg total) by mouth 3 (three) times daily as needed for anxiety. 09/02/17   Loren Racer, MD    Family History No family history on file.  Social History Social History   Tobacco Use  . Smoking status: Current Every Day Smoker    Packs/day: 0.50    Types: Cigarettes  . Smokeless tobacco: Never Used  Substance Use Topics  . Alcohol use: No  . Drug use: No      Allergies   Patient has no known allergies.   Review of Systems Review of Systems  Constitutional: Negative for chills and fever.  HENT: Negative for trouble swallowing.   Eyes: Negative for visual disturbance.  Respiratory: Negative for cough and shortness of breath.   Cardiovascular: Positive for chest pain and palpitations. Negative for leg swelling.  Gastrointestinal: Negative for abdominal pain, nausea and vomiting.  Musculoskeletal: Negative for back pain and myalgias.  Skin: Negative for rash and wound.  Neurological: Positive for light-headedness. Negative for dizziness, syncope, weakness, numbness and headaches.  Psychiatric/Behavioral: The patient is nervous/anxious.   All other systems reviewed and are negative.    Physical Exam Updated Vital Signs BP 139/87   Pulse (!) 101   Temp 98.4 F (36.9 C) (Oral)   Resp 18   Ht  (1.702 m)   Wt 92.5 kg (204 lb)   SpO2 98%   BMI 31.95 kg/m   Physical Exam  Constitutional: He is oriented to person, place, and time. He appears well-developed and well-nourished. No distress.  Mildly anxious appearing  HENT:  Head: Normocephalic and atraumatic.  Mouth/Throat: Oropharynx is clear and moist. No oropharyngeal exudate.  Eyes: Pupils are equal, round, and reactive to light. EOM are normal.  No nystagmus  Neck: Normal range of motion. Neck supple. No JVD present.  Cardiovascular: Regular rhythm. Exam reveals no gallop and no friction rub.  No murmur heard. Mild tachycardia  Pulmonary/Chest: Effort normal and breath sounds normal. No stridor. No respiratory distress. He has no wheezes. He has no rales. He exhibits no tenderness.  Abdominal: Soft. Bowel sounds are normal. There is no tenderness. There is no rebound and no guarding.  Musculoskeletal: Normal range of motion. He exhibits no edema or tenderness.  No lower extremity swelling, asymmetry or tenderness.  Distal pulses are 2+.  Lymphadenopathy:    He has no  cervical adenopathy.  Neurological: He is alert and oriented to person, place, and time.  Patient is alert and oriented x3 with clear, goal oriented speech. Patient has 5/5 motor in all extremities. Sensation is intact to light touch. Bilateral finger-to-nose is normal with no signs of dysmetria. Patient has a normal gait and walks without assistance.  Skin: Skin is warm and dry. No rash noted. He is not diaphoretic. No erythema.  Psychiatric: His behavior is normal.  Nursing note and vitals reviewed.    ED Treatments / Results  Labs (all labs ordered are listed, but only abnormal results are displayed) Labs Reviewed  BASIC METABOLIC PANEL - Abnormal; Notable for the following components:      Result Value   Potassium 3.4 (*)    Chloride 99 (*)    Glucose, Bld 123 (*)    All other components within normal limits  TROPONIN I  CBC WITH DIFFERENTIAL/PLATELET  URINALYSIS, ROUTINE W REFLEX MICROSCOPIC  RAPID URINE DRUG SCREEN, HOSP PERFORMED  TSH  MAGNESIUM    EKG EKG Interpretation  Date/Time:  Monday Sep 02 2017 17:43:33 EDT Ventricular Rate:  121 PR Interval:  158 QRS Duration: 88 QT Interval:  308 QTC Calculation: 437 R Axis:   46 Text Interpretation:  Sinus tachycardia Possible Anterior infarct , age undetermined Abnormal ECG Confirmed by Loren Racer ([id number]) on 09/02/2017 9:55:57 PM   Radiology Dg Chest 2 View  Result Date: 09/02/2017 CLINICAL DATA:  Mid chest pain and dizziness.  Current smoker. EXAM: CHEST - 2 VIEW COMPARISON:  11/06/2016 FINDINGS: The heart size and mediastinal contours are within normal limits. Both lungs are clear. The visualized skeletal structures are unremarkable. IMPRESSION: No active cardiopulmonary disease. Electronically Signed   By: Tollie Eth M.D.   On: 09/02/2017 18:29    Procedures Procedures (including critical care time)  Medications Ordered in ED Medications  potassium chloride SA (K-DUR,KLOR-CON) CR tablet 40 mEq (has no  administration in time range)     Initial Impression / Assessment and Plan / ED Course  I have reviewed the triage vital signs and the nursing notes.  Pertinent labs & imaging results that were available during my care of the patient were reviewed by me and considered in my medical decision making (see chart for details).     Tachycardia and hypertension resolved without intervention.  EKG with no evidence of ischemia.  Normal troponin.  Low suspicion for PE.  Suspect symptoms may be related to anxiety.  Will give low-dose Xanax as needed.  Advise follow-up with cardiology and with primary physician.  Return precautions given.  Final Clinical Impressions(s) / ED Diagnoses   Final diagnoses:  Palpitations  Elevated blood pressure, situational  Atypical chest pain    ED Discharge Orders        Ordered    ALPRAZolam (XANAX) 0.25 MG tablet  3 times daily PRN     09/02/17 2356       Loren Racer, MD 09/02/17 2358    Loren Racer, MD 10/11/17  1720  

## 2017-10-24 ENCOUNTER — Encounter: Payer: Self-pay | Admitting: Physical Medicine & Rehabilitation

## 2017-11-04 ENCOUNTER — Ambulatory Visit: Payer: Medicaid Other | Admitting: Physical Medicine & Rehabilitation

## 2017-11-04 ENCOUNTER — Encounter: Payer: Self-pay | Admitting: Physical Medicine & Rehabilitation

## 2017-11-04 ENCOUNTER — Encounter: Payer: Medicaid Other | Attending: Physical Medicine & Rehabilitation

## 2017-11-04 VITALS — BP 142/94 | HR 86 | Ht 67.0 in | Wt 207.0 lb

## 2017-11-04 DIAGNOSIS — I10 Essential (primary) hypertension: Secondary | ICD-10-CM | POA: Insufficient documentation

## 2017-11-04 DIAGNOSIS — M545 Low back pain, unspecified: Secondary | ICD-10-CM

## 2017-11-04 DIAGNOSIS — F17290 Nicotine dependence, other tobacco product, uncomplicated: Secondary | ICD-10-CM | POA: Insufficient documentation

## 2017-11-04 DIAGNOSIS — M47816 Spondylosis without myelopathy or radiculopathy, lumbar region: Secondary | ICD-10-CM | POA: Diagnosis not present

## 2017-11-04 DIAGNOSIS — G8929 Other chronic pain: Secondary | ICD-10-CM | POA: Insufficient documentation

## 2017-11-04 NOTE — Patient Instructions (Signed)
Facet Syndrome Facet syndrome is a condition in which joints (facet joints) that connect the bones of the spine (vertebrae) become damaged. Facet joints help the spine move, and they usually wear down (degenerate) or become inflamed as you age. This can cause pain and stiffness in the neck (cervical facet syndrome) or in the lower back (lumbar facet syndrome). When a facet joint becomes damaged, a vertebra may slip forward, out of its normal place in the spine. Damage to a facet joint can also damage nerves near the spine, which can cause tingling or weakness in the arms or legs. Facet syndrome can make it difficult to turn the head or bend backward without pain. This condition typically gets worse over time. What are the causes? Common causes of this condition include:  Age-related inflammation of the facet joints (arthritis) that may create extra bone on the joint surface (bone spurs).  Age-related decrease in space between the vertebrae (disk degeneration and cartilage degeneration).  Repetitive stress on the spine, such as repetitive twisting of the back.  Injury (trauma) to the back or neck.  What increases the risk? The following factors may make you more likely to develop this condition:  Playing contact sports.  Doing activities or sports that involve repetitive twisting motions or repetitive heavy lifting.  Having poor back strength and flexibility.  Having another back or spine condition, such as scoliosis.  What are the signs or symptoms? Symptoms of facet syndrome may include:  An ache in the neck or lower back. This may get worse when you twist or arch your back, or when you look up.  Stiffness in the neck or lower back.  Numbness, tingling, or weakness in the arms or legs.  Symptoms of cervical facet syndrome may include:  Headache.  Pain at the back of the head.  Pain in the shoulder blades.  Symptoms of lumbar facet syndrome may include pain in any of the  following areas:  Groin.  Thighs.  Lower back.  Buttocks.  Hips.  How is this diagnosed? This condition may be diagnosed based on:  Your symptoms.  Your medical history.  A physical exam.  Imaging tests, such as: ? X-rays. ? MRI.  A procedure in which medicines to numb the area (local anesthetic) and medicines to reduce inflammation (steroids) are injected into your affected joint (facet joint block).  How is this treated? Treatment for this condition may include:  Stopping or modifying activities that make your condition worse.  Medicines that help reduce pain and inflammation.  Steroid injections to help reduce severe pain.  Physical therapy.  Radiofrequency ablation. This is a surgical procedure that uses high-frequency radio waves to block signals from affected nerves.  Surgery to stabilize your spine or to take pressure off your nerves. This is rare.  Follow these instructions at home: Activity  Rest your neck and back as told by your health care provider.  Return to your normal activities as told by your health care provider. Ask your health care provider what activities are safe for you.  If physical therapy was prescribed, do exercises as told by your health care provider. General instructions  Take over-the-counter and prescription medicines only as told by your health care provider.  Do not drive or operate heavy machinery while taking prescription pain medicines.  Do not use any tobacco products, such as cigarettes, chewing tobacco, and e-cigarettes. Tobacco can delay bone healing. If you need help quitting, ask your health care provider.  Use good   posture throughout your daily activities. Good posture means that your spine is in its natural S-curve position (your spine is neutral), your shoulders are pulled back slightly, and your head is not forward.  Keep all follow-up visits as told by your health care provider. This is important. Contact a  health care provider if:  You have symptoms that get worse or do not improve in 2-4 weeks of treatment.  You have numbness or weakness in any part of your body.  You lose control over your bladder or bowel function. This information is not intended to replace advice given to you by your health care provider. Make sure you discuss any questions you have with your health care provider. Document Released: 04/02/2005 Document Revised: 05/04/2015 Document Reviewed: 12/13/2014 Elsevier Interactive Patient Education  2018 [employer] Inc.  

## 2017-11-04 NOTE — Progress Notes (Signed)
Subjective:    Patient ID: Kent Carter, male    DOB: March 07, 1991, 27 y.o.   MRN: [id number]  HPI Chief complaint low back pain 27 year old male with 5-year history of low back pain.  No history of accident or injury.  Pain does not radiate into the lower limbs.  No bowel or bladder dysfunction.  No history of fever or weight loss.  Patient has occasional right shoulder pain, occasional pain in the left buttocks area. Past medical history is positive for hypertension. Patient was tried on muscle relaxers, tried on several of these are not very helpful by his sports medicine physician Dr. Cleophas Dunker.  In addition he was tried on gabapentin was helpful.  Currently is taking gabapentin 600 mg 3 times per day.  He tried 800 mg 3 times a day but this caused excessive drowsiness.  Patient was given some lumbar exercises which she no longer does.  He states that they were stretching forward and backward. Patient has not tried any type of spinal injections. Father uncle had back pain but had not had any type of surgeries. No family history of arthritis although the patient states he does not have much contact with his family. 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 Pain Inventory Average Pain 10 Pain Right Now 6 My pain is sharp, stabbing and aching  In the last 24 hours, has pain interfered with the following? General activity 8 Relation with others 1 Enjoyment of life 8 What TIME of day is your pain at its worst? evening Sleep (in general) Fair  Pain is worse with: walking, bending, sitting, standing and some activites Pain improves with: rest and medication Relief from Meds: 4  Mobility walk without assistance ability to climb steps?  yes do you drive?  no  Function employed # of hrs/week  48  Neuro/Psych weakness numbness anxiety  Prior Studies Any changes since last visit?  no  Physicians involved in your care Any changes since last visit?  no   History reviewed. No pertinent family history. Social History   Socioeconomic History  . Marital status: Married    Spouse name: Not on file  . Number of children: Not on file  . Years of education: Not on file  . Highest education level: Not on file  Occupational History  . Not on file  Social Needs  . Financial resource strain: Not on file  . Food insecurity:    Worry: Not on file    Inability: Not on file  . Transportation needs:    Medical: Not on file    Non-medical: Not on file  Tobacco Use  . Smoking status: Current Some Day Smoker    Years: 13.00    Types: Cigars  . Smokeless tobacco: Never Used  Substance and Sexual Activity  . Alcohol use: No  . Drug use: No  . Sexual activity: Not on file  Lifestyle  . Physical activity:    Days per week: Not on file    Minutes per session: Not on file  . Stress: Not on file  Relationships  . Social connections:    Talks on phone: Not on file    Gets together: Not on file    Attends religious service: Not on file    Active member of club or  organization: Not on file    Attends meetings of clubs or organizations: Not on file    Relationship status: Not on file  Other Topics Concern  . Not on file  Social History Narrative  . Not on file   Past Surgical History:  Procedure Laterality Date  . WISDOM TOOTH EXTRACTION     Past Medical History:  Diagnosis Date  . Chronic back pain   . Hypertension    BP (!) 142/94   Pulse 86   Ht 5\' 7"  (1.702 m)   Wt 207 lb (93.9 kg)   SpO2 96%   BMI 32.42 kg/m   Opioid Risk Score:   Fall Risk Score:  `1  Depression screen PHQ 2/9  No flowsheet data found.\  Review of Systems  Constitutional: Negative.   HENT: Negative.   Eyes: Negative.   Respiratory: Negative.   Cardiovascular: Negative.    Gastrointestinal: Negative.   Endocrine: Negative.   Genitourinary: Negative.   Musculoskeletal: Positive for arthralgias, back pain and myalgias.  Skin: Negative.   Allergic/Immunologic: Negative.   Neurological: Positive for weakness and numbness.  Hematological: Negative.   Psychiatric/Behavioral: The patient is nervous/anxious.   All other systems reviewed and are negative.      Objective:   Physical Exam  Constitutional: He is oriented to person, place, and time. He appears well-developed and well-nourished. No distress.  HENT:  Head: Normocephalic and atraumatic.  Eyes: Pupils are equal, round, and reactive to light. EOM are normal.  Neck: Normal range of motion.  Cardiovascular: Normal rate, regular rhythm and normal heart sounds. Exam reveals no friction rub.  No murmur heard. Pulmonary/Chest: Effort normal and breath sounds normal. No respiratory distress.  Neurological: He is alert and oriented to person, place, and time.  Skin: Skin is warm and dry. He is not diaphoretic.  Psychiatric: He has a normal mood and affect. His behavior is normal. Judgment and thought content normal.  Nursing note and vitals reviewed. Motor strength is 5/5 bilateral deltoid, bicep, tricep, grip Finger-nose-finger testing is normal bilaterally Cervical spine range of motion is normal negative foraminal compression Sensation intact bilateral C5 C6-C7-C8 dermatomal distribution Deep tendon reflexes normal bilateral biceps triceps brachiorad 1+ bilateral patella ,bilateral Achilles Sensation intact in bilateral L2-L3-L4 L5-S1 dermatomal distribution. Negative straight leg raising Hip range of motion is normal No pain with knee or ankle range of motion. Lumbar spine has normal range of motion flexion extension lateral bending and rotation. Patient does have pain around L2-3 area left side greater than right side this is with forward flexion extension as well as lateral bending.         Assessment & Plan:  1.  Chronic left greater than right sided low back pain.  Pain localizes to around the L2-3 area left side greater than right side. There is no evidence of radiculopathy. No evidence of neurologic compromise. Reviewed his MRI and compared to the spine model.  In addition to the radiology findings, there is evidence of L2-L3 facet joint complex arthropathy with some increased fluid in the joint.  Similar findings at L3-4 although milder We discussed that even no he is young, he has been doing manual labor for at least 5 years and that facet arthropathy certainly may be a pain generator in his situation. I am not convinced that the L1- L2 disc dehydration is symptomatic. Patient will return for left L1-L2-L3 medial branch blocks under fluoroscopic guidance to isolate a pain generator

## 2017-11-25 ENCOUNTER — Ambulatory Visit (HOSPITAL_BASED_OUTPATIENT_CLINIC_OR_DEPARTMENT_OTHER): Payer: Medicaid Other | Admitting: Physical Medicine & Rehabilitation

## 2017-11-25 ENCOUNTER — Encounter: Payer: Medicaid Other | Attending: Physical Medicine & Rehabilitation

## 2017-11-25 ENCOUNTER — Encounter: Payer: Self-pay | Admitting: Physical Medicine & Rehabilitation

## 2017-11-25 VITALS — BP 143/95 | HR 102 | Resp 14 | Ht 67.0 in | Wt 209.0 lb

## 2017-11-25 DIAGNOSIS — M545 Low back pain: Secondary | ICD-10-CM | POA: Insufficient documentation

## 2017-11-25 DIAGNOSIS — I10 Essential (primary) hypertension: Secondary | ICD-10-CM | POA: Diagnosis not present

## 2017-11-25 DIAGNOSIS — F17290 Nicotine dependence, other tobacco product, uncomplicated: Secondary | ICD-10-CM | POA: Diagnosis not present

## 2017-11-25 DIAGNOSIS — M47816 Spondylosis without myelopathy or radiculopathy, lumbar region: Secondary | ICD-10-CM | POA: Diagnosis not present

## 2017-11-25 DIAGNOSIS — G8929 Other chronic pain: Secondary | ICD-10-CM | POA: Diagnosis not present

## 2017-11-25 NOTE — Patient Instructions (Signed)

## 2017-11-25 NOTE — Progress Notes (Signed)
Left  L1, L2 , L3 medial branch blocks under fluoroscopic guidance  Indication: Lumbar pain which is not relieved by medication management or other conservative care and interfering with self-care and mobility.  Informed consent was obtained after describing risks and benefits of the procedure with the patient, this includes bleeding, bruising, infection, paralysis and medication side effects. The patient wishes to proceed and has given written consent. The patient was placed in a prone position. The lumbar area was marked and prepped with Betadine. One ML of 1% lidocaine was injected into each of 3 areas into the skin and subcutaneous tissue. Then a 22-gauge 3.5in spinal needle was inserted targeting the junction of the left L3 superior articular process /transverse process junction. Needle was advanced under fluoroscopic guidance. Bone contact was made.Isovue 200 was injected x0.5 mL demonstrating no intravascular uptake. Then a solution containing 2% MPF lidocaine was injected x0.5 mL. Then the left L2 superior articular process in transverse process junction was targeted. Bone contact was made.Isovue 200 was injected x0.5 mL demonstrating no intravascular uptake. Then a solution containing 2% MPF lidocaine was injected x0.5 mL . Then the left L4 superior articular process in transverse process junction was targeted.Bone contact was made.Isovue 200 was injected x0.5 mL demonstrating no intravascular uptake. Then a solution containing 2% MPF lidocaine was injected x0.5 mL.. Patient tolerated procedure well. Post procedure instructions were given. Please refer to post procedure form.  Pre injection 7/10, post injection 4/10

## 2017-11-25 NOTE — Progress Notes (Signed)
  PROCEDURE RECORD  Physical Medicine and Rehabilitation   Name: Kent Carter DOB:1990-06-09 MRN: [id number]  Date:11/25/2017  Physician: Claudette LawsAndrew Kirsteins, MD    Nurse/CMA: Juandedios Dudash, CMA  Allergies: No Known Allergies  Consent Signed: Yes.    Is patient diabetic? No.  CBG today?   Pregnant: No. LMP: No LMP for male patient. (age 26-55)  Anticoagulants: no Anti-inflammatory: no Antibiotics: no  Procedure: Left L3,4,5 Medial Branch Block   Position: Prone Start Time: 4:13pm  End Time: 4:19pm  Fluoro Time: 42s  RN/CMA Raliegh Scobie, CMA Ausencio Vaden, CMA    Time 3:40pm 4:24pm    BP 143/95 129/81    Pulse 102 100    Respirations 14 14    O2 Sat 95 95    S/S 6 6    Pain Level 7/10 4/10     D/C home with wife, patient A & O X 3, D/C instructions reviewed, and sits independently.

## 2017-12-27 ENCOUNTER — Encounter: Payer: Medicaid Other | Attending: Physical Medicine & Rehabilitation

## 2017-12-27 ENCOUNTER — Ambulatory Visit: Payer: Medicaid Other | Admitting: Physical Medicine & Rehabilitation

## 2017-12-27 ENCOUNTER — Encounter: Payer: Self-pay | Admitting: Physical Medicine & Rehabilitation

## 2017-12-27 VITALS — BP 142/89 | HR 88 | Resp 14 | Ht 67.0 in | Wt 211.0 lb

## 2017-12-27 DIAGNOSIS — M545 Low back pain: Secondary | ICD-10-CM | POA: Diagnosis present

## 2017-12-27 DIAGNOSIS — F17290 Nicotine dependence, other tobacco product, uncomplicated: Secondary | ICD-10-CM | POA: Insufficient documentation

## 2017-12-27 DIAGNOSIS — I10 Essential (primary) hypertension: Secondary | ICD-10-CM | POA: Insufficient documentation

## 2017-12-27 DIAGNOSIS — G8929 Other chronic pain: Secondary | ICD-10-CM | POA: Diagnosis present

## 2017-12-27 DIAGNOSIS — M47816 Spondylosis without myelopathy or radiculopathy, lumbar region: Secondary | ICD-10-CM

## 2017-12-27 NOTE — Progress Notes (Signed)
  PROCEDURE RECORD [place]   Name: Kent Carter DOB:29-Nov-1990 MRN: [id number]  Date:12/27/2017  Physician: Claudette LawsAndrew Kirsteins, MD    Nurse/CMA: Ermon Sagan, CMA  Allergies: No Known Allergies  Consent Signed: Yes.    Is patient diabetic? No.  CBG today?   Pregnant: No. LMP: No LMP for male patient. (age 26-55)  Anticoagulants: no Anti-inflammatory: no Antibiotics: no  Procedure: Left L1,2,3,4 medial branch block  Position: Prone Start Time: 3:27pm End Time: 3:36pm  Fluoro Time: 32s  RN/CMA Bhargav Barbaro, CMA Sherril Shipman, CMA    Time 3:20pm 3:42pm    BP 142/89 149/93    Pulse 88 86    Respirations 16 16    O2 Sat 95 96    S/S 6 6    Pain Level 9/10 6/10     D/C home with wife, patient A & O X 3, D/C instructions reviewed, and sits independently.

## 2017-12-27 NOTE — Progress Notes (Signed)
Left  L1, L2 , L3, L4 medial branch blocks under fluoroscopic guidance  Indication: Lumbar pain which is not relieved by medication management or other conservative care and interfering with self-care and mobility.  Informed consent was obtained after describing risks and benefits of the procedure with the patient, this includes bleeding, bruising, infection, paralysis and medication side effects. The patient wishes to proceed and has given written consent. The patient was placed in a prone position. The lumbar area was marked and prepped with Betadine. One ML of 1% lidocaine was injected into each of 3 areas into the skin and subcutaneous tissue. Then a 22-gauge 3.5in spinal needle was inserted targeting the junction of the left L3 superior articular process /transverse process junction. Needle was advanced under fluoroscopic guidance. Bone contact was made.Isovue 200 was injected x0.5 mL demonstrating no intravascular uptake. Then a solution containing 2% MPF lidocaine was injected x0.5 mL. Then the left L2 superior articular process in transverse process junction was targeted. Bone contact was made.Isovue 200 was injected x0.5 mL demonstrating no intravascular uptake. Then a solution containing 2% MPF lidocaine was injected x0.5 mL . Then the left L4 superior articular process in transverse process junction was targeted.Bone contact was made.Isovue 200 was injected x0.5 mL demonstrating no intravascular uptake. Then a solution containing 2% MPF lidocaine was injected x0.5 mL..Then the left L5 superior articular process in transverse process junction was targeted.Bone contact was made.Isovue 200 was injected x0.5 mL demonstrating no intravascular uptake. Then a solution containing 2% MPF lidocaine was injected x0.5 mL. Patient tolerated procedure well. Post procedure instructions were given. Please refer to post procedure form.  Pre injection 9/10, post injection 6/10

## 2017-12-27 NOTE — Patient Instructions (Signed)
Lumbar medial branch blocks were performed. This is to help diagnose the cause of the low back pain. It is important that you keep track of your pain for the first day or 2 after injection. This injection can give you temporary relief that lasts for hours or up to several months. There is no way to predict duration of pain relief.  Please try to compare your pain after injection to for the injection.  If this injection gives you  temporary relief there may be another longer-lasting procedure that may be beneficial call radiofrequency ablation Back Exercises If you have pain in your back, do these exercises 2-3 times each day or as told by your doctor. When the pain goes away, do the exercises once each day, but repeat the steps more times for each exercise (do more repetitions). If you do not have pain in your back, do these exercises once each day or as told by your doctor. Exercises Single Knee to Chest  Do these steps 3-5 times in a row for each leg: 1. Lie on your back on a firm bed or the floor with your legs stretched out. 2. Bring one knee to your chest. 3. Hold your knee to your chest by grabbing your knee or thigh. 4. Pull on your knee until you feel a gentle stretch in your lower back. 5. Keep doing the stretch for 10-30 seconds. 6. Slowly let go of your leg and straighten it.  Pelvic Tilt  Do these steps 5-10 times in a row: 1. Lie on your back on a firm bed or the floor with your legs stretched out. 2. Bend your knees so they point up to the ceiling. Your feet should be flat on the floor. 3. Tighten your lower belly (abdomen) muscles to press your lower back against the floor. This will make your tailbone point up to the ceiling instead of pointing down to your feet or the floor. 4. Stay in this position for 5-10 seconds while you gently tighten your muscles and breathe evenly.  Cat-Cow  Do these steps until your lower back bends more easily: 1. Get on your hands and knees on  a firm surface. Keep your hands under your shoulders, and keep your knees under your hips. You may put padding under your knees. 2. Let your head hang down, and make your tailbone point down to the floor so your lower back is round like the back of a cat. 3. Stay in this position for 5 seconds. 4. Slowly lift your head and make your tailbone point up to the ceiling so your back hangs low (sags) like the back of a cow. 5. Stay in this position for 5 seconds.  Press-Ups  Do these steps 5-10 times in a row: 1. Lie on your belly (face-down) on the floor. 2. Place your hands near your head, about shoulder-width apart. 3. While you keep your back relaxed and keep your hips on the floor, slowly straighten your arms to raise the top half of your body and lift your shoulders. Do not use your back muscles. To make yourself more comfortable, you may change where you place your hands. 4. Stay in this position for 5 seconds. 5. Slowly return to lying flat on the floor.  Bridges  Do these steps 10 times in a row: 1. Lie on your back on a firm surface. 2. Bend your knees so they point up to the ceiling. Your feet should be flat on the floor. 3.  Tighten your butt muscles and lift your butt off of the floor until your waist is almost as high as your knees. If you do not feel the muscles working in your butt and the back of your thighs, slide your feet 1-2 inches farther away from your butt. 4. Stay in this position for 3-5 seconds. 5. Slowly lower your butt to the floor, and let your butt muscles relax.  If this exercise is too easy, try doing it with your arms crossed over your chest. Belly Crunches  Do these steps 5-10 times in a row: 1. Lie on your back on a firm bed or the floor with your legs stretched out. 2. Bend your knees so they point up to the ceiling. Your feet should be flat on the floor. 3. Cross your arms over your chest. 4. Tip your chin a little bit toward your chest but do not bend  your neck. 5. Tighten your belly muscles and slowly raise your chest just enough to lift your shoulder blades a tiny bit off of the floor. 6. Slowly lower your chest and your head to the floor.  Back Lifts Do these steps 5-10 times in a row: 1. Lie on your belly (face-down) with your arms at your sides, and rest your forehead on the floor. 2. Tighten the muscles in your legs and your butt. 3. Slowly lift your chest off of the floor while you keep your hips on the floor. Keep the back of your head in line with the curve in your back. Look at the floor while you do this. 4. Stay in this position for 3-5 seconds. 5. Slowly lower your chest and your face to the floor.  Contact a doctor if:  Your back pain gets a lot worse when you do an exercise.  Your back pain does not lessen 2 hours after you exercise. If you have any of these problems, stop doing the exercises. Do not do them again unless your doctor says it is okay. Get help right away if:  You have sudden, very bad back pain. If this happens, stop doing the exercises. Do not do them again unless your doctor says it is okay. This information is not intended to replace advice given to you by your health care provider. Make sure you discuss any questions you have with your health care provider. Document Released: 05/05/2010 Document Revised: 09/08/2015 Document Reviewed: 05/27/2014 Elsevier Interactive Patient Education  [employer]2018 [employer]

## 2018-01-20 ENCOUNTER — Telehealth: Payer: Self-pay

## 2018-01-20 NOTE — Telephone Encounter (Signed)
Pt requesting refill on Gabapentin 300 mg two three times a day. Dr. Raford Pitcher was originally prescribing but was referred to this office, Dr. Raford Pitcher told pt this office was suppose to take over prescribing. Okay to refill?

## 2018-01-21 MED ORDER — GABAPENTIN 600 MG PO TABS: 600.0000 mg | ORAL_TABLET | Freq: Three times a day (TID) | ORAL | 2 refills | Status: DC

## 2018-01-21 NOTE — Telephone Encounter (Signed)
This was the Gabapentin pt , used e rx to prescribe

## 2018-01-21 NOTE — Telephone Encounter (Signed)
Send e rx to [facility] Gabapentin 600mg  TID #90 [id number] RF

## 2018-01-24 ENCOUNTER — Ambulatory Visit: Payer: Self-pay | Admitting: Physical Medicine & Rehabilitation

## 2018-01-24 ENCOUNTER — Encounter: Payer: Medicaid Other | Attending: Physical Medicine & Rehabilitation

## 2018-01-24 DIAGNOSIS — F17290 Nicotine dependence, other tobacco product, uncomplicated: Secondary | ICD-10-CM | POA: Insufficient documentation

## 2018-01-24 DIAGNOSIS — G8929 Other chronic pain: Secondary | ICD-10-CM | POA: Insufficient documentation

## 2018-01-24 DIAGNOSIS — M545 Low back pain: Secondary | ICD-10-CM | POA: Insufficient documentation

## 2018-01-24 DIAGNOSIS — I10 Essential (primary) hypertension: Secondary | ICD-10-CM | POA: Insufficient documentation

## 2018-03-27 ENCOUNTER — Other Ambulatory Visit: Payer: Self-pay

## 2018-03-27 MED ORDER — GABAPENTIN 600 MG PO TABS: 600.0000 mg | ORAL_TABLET | Freq: Three times a day (TID) | ORAL | 0 refills | Status: DC

## 2018-04-22 ENCOUNTER — Other Ambulatory Visit: Payer: Self-pay | Admitting: Physical Medicine & Rehabilitation

## 2018-07-01 IMAGING — DX DG CHEST 2V
2 series · 2 of 2 positions shown · non-contrast
Comparison: 11/06/2016

CLINICAL DATA: Mid chest pain and dizziness.  Current smoker.

EXAM:
CHEST - 2 VIEW

[chest pa]
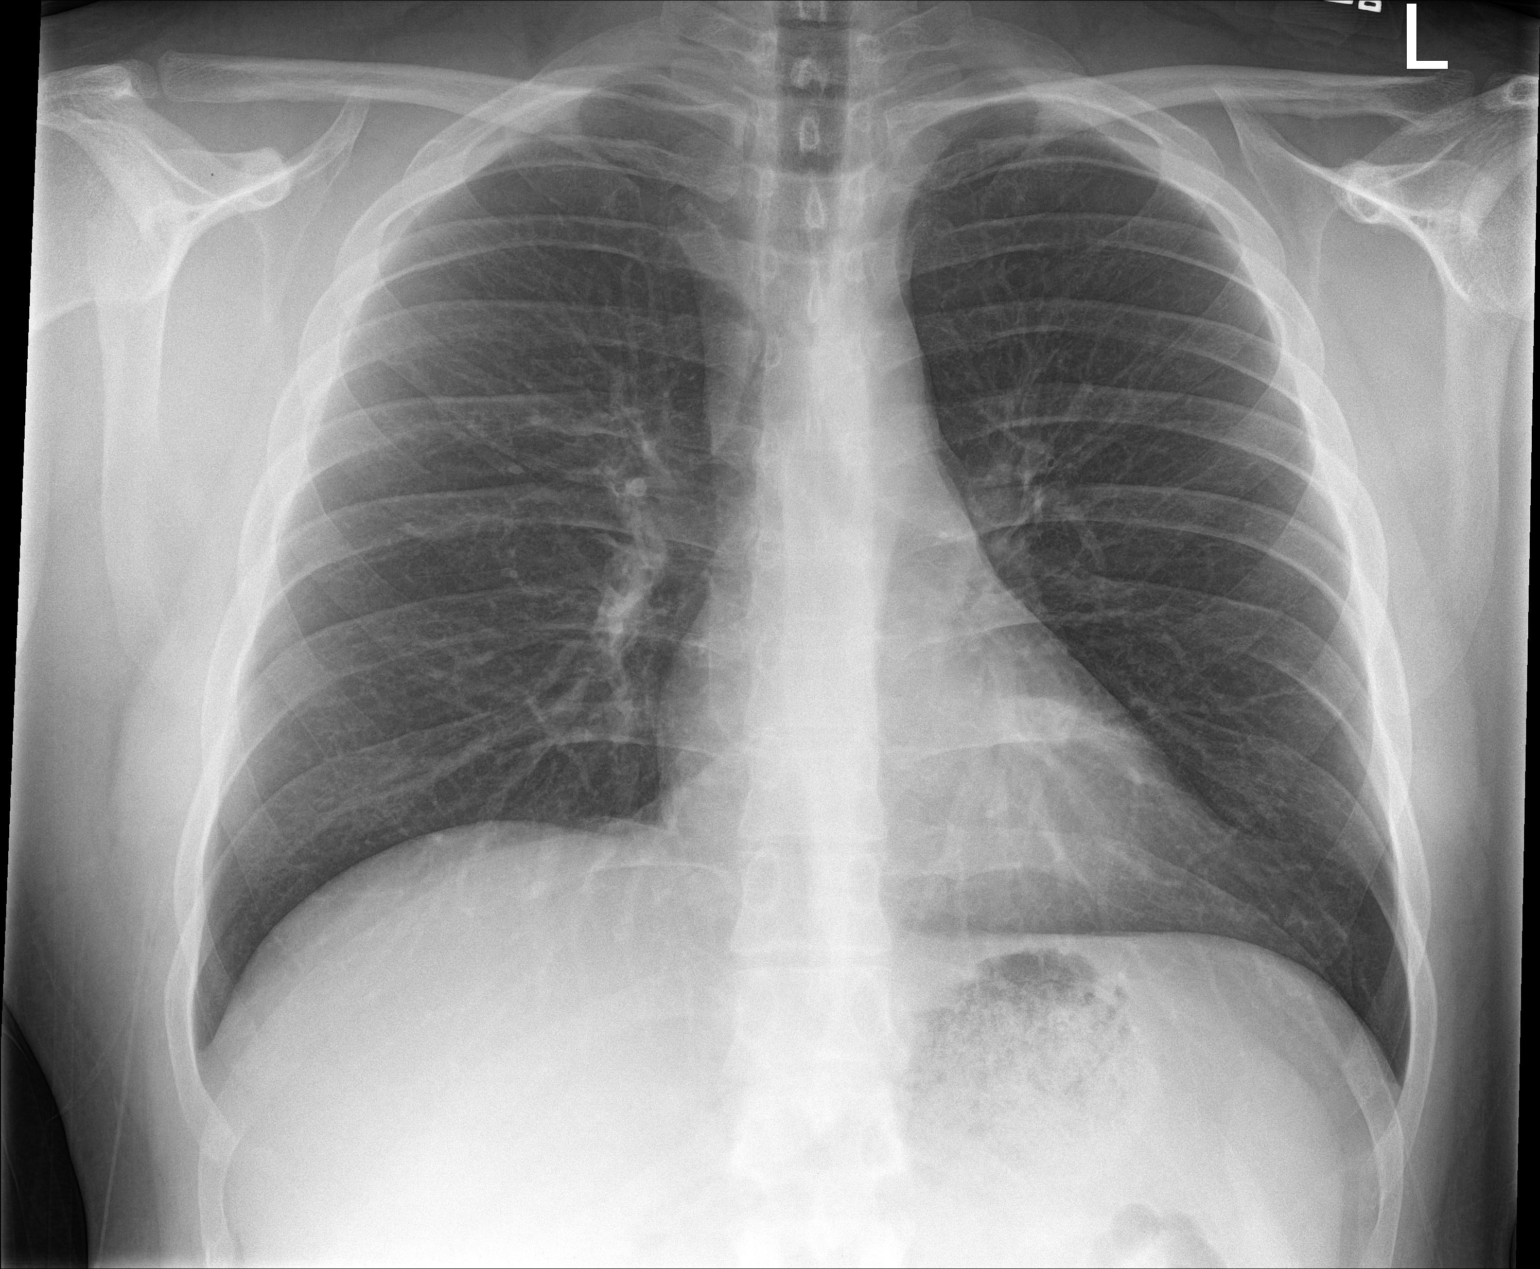

[chest lat]
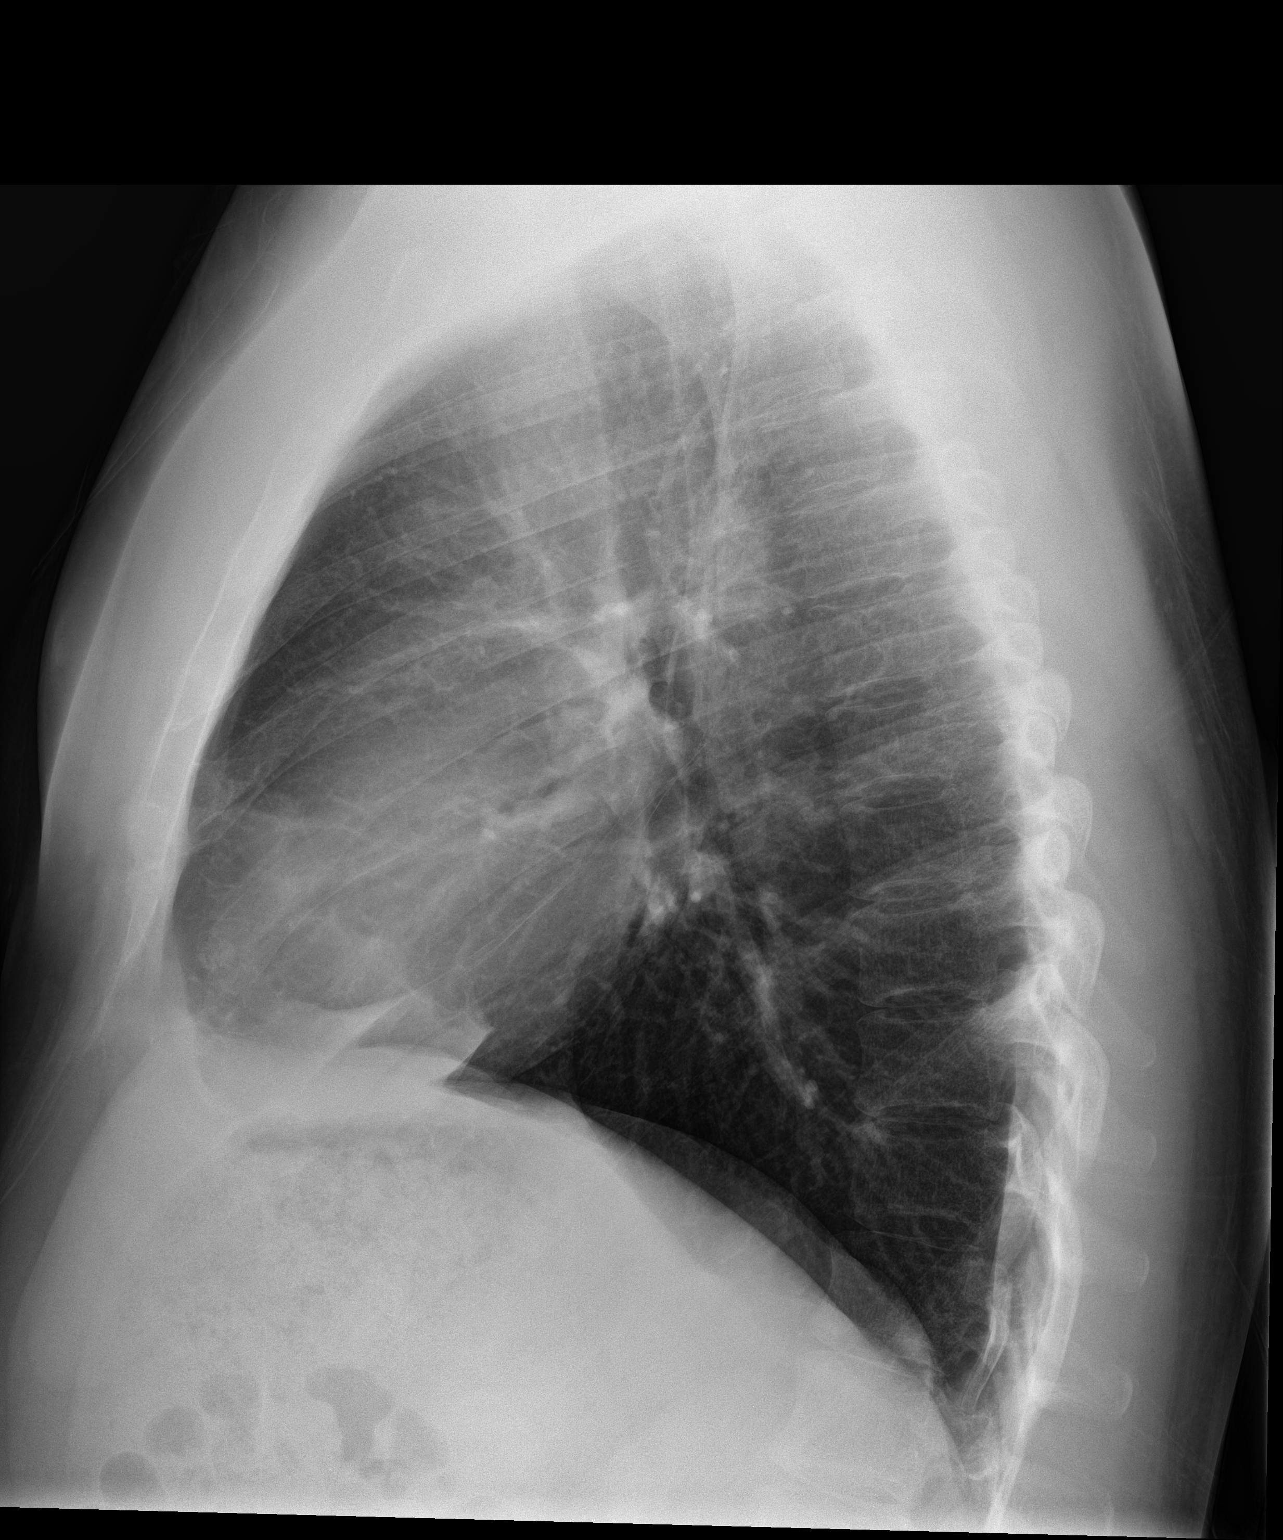

[2 of 2 positions shown; findings below may reference images not displayed]

FINDINGS: The heart size and mediastinal contours are within normal limits.
Both lungs are clear. The visualized skeletal structures are
unremarkable.
IMPRESSION: No active cardiopulmonary disease.

## 2018-11-14 ENCOUNTER — Encounter: Payer: Medicaid Other | Attending: Physical Medicine & Rehabilitation | Admitting: Physical Medicine & Rehabilitation

## 2018-12-04 ENCOUNTER — Telehealth: Payer: Self-pay | Admitting: *Deleted

## 2018-12-04 NOTE — Telephone Encounter (Signed)
We will have to wait until the next visit

## 2018-12-04 NOTE — Telephone Encounter (Signed)
Mrs Mascio called and is asking if we can refill Kent Carter's gabapentin.  He has not been seen in a year but has an appt scheduled with Dr Letta Pate on 12/15/08 for increased back pain.  Please advise.

## 2018-12-05 NOTE — Telephone Encounter (Signed)
I have spoken with Kent Carter.  They are requesting earlier appt if available and I passed this on to appointment desk.

## 2018-12-11 ENCOUNTER — Ambulatory Visit: Payer: Medicaid Other | Admitting: Physical Medicine & Rehabilitation

## 2018-12-16 ENCOUNTER — Other Ambulatory Visit: Payer: Self-pay

## 2018-12-16 ENCOUNTER — Encounter: Payer: Self-pay | Admitting: Physical Medicine & Rehabilitation

## 2018-12-16 ENCOUNTER — Encounter: Payer: Medicaid Other | Attending: Physical Medicine & Rehabilitation | Admitting: Physical Medicine & Rehabilitation

## 2018-12-16 VITALS — BP 149/88 | HR 100 | Temp 98.7°F | Ht 67.0 in | Wt 204.8 lb

## 2018-12-16 DIAGNOSIS — M545 Low back pain: Secondary | ICD-10-CM | POA: Insufficient documentation

## 2018-12-16 DIAGNOSIS — I10 Essential (primary) hypertension: Secondary | ICD-10-CM | POA: Insufficient documentation

## 2018-12-16 DIAGNOSIS — M62830 Muscle spasm of back: Secondary | ICD-10-CM | POA: Insufficient documentation

## 2018-12-16 DIAGNOSIS — G8929 Other chronic pain: Secondary | ICD-10-CM | POA: Diagnosis present

## 2018-12-16 DIAGNOSIS — F17290 Nicotine dependence, other tobacco product, uncomplicated: Secondary | ICD-10-CM | POA: Diagnosis not present

## 2018-12-16 MED ORDER — TIZANIDINE HCL 2 MG PO TABS: 2.0000 mg | ORAL_TABLET | Freq: Three times a day (TID) | ORAL | 0 refills | Status: AC | PRN

## 2018-12-16 NOTE — Progress Notes (Signed)
Subjective:    Patient ID: Kent Carter, male    DOB: 01-23-1991, 28 y.o.   MRN: [id number]  HPI  28 year old male with history of chronic low back pain.  He has had pain for greater than 5 years.  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 No new falls or injurys Patient had an MRI in 2018 that we reviewed. His pain is mainly left side mid to lower back Occ pain left thigh Pt has some relief with heat, loosens up his back.  He feels like he has knots in his back. Pain Inventory Average Pain 10 Pain Right Now 8 My pain is sharp, stabbing and aching  In the last 24 hours, has pain interfered with the following? General activity 10 Relation with others n/a Enjoyment of life 10 What TIME of day is your pain at its worst? all day Sleep (in general) n/a  Pain is worse with: walking, bending, sitting and standing Pain improves with: medication Relief from Meds: 3  Mobility ability to climb steps?  yes do you drive?  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  no  Physicians involved in your care Any changes since last visit?  no   No family history on file. Social History   Socioeconomic History  . Marital status: Married    Spouse name: Not on file  . Number of children: Not on file  . Years of education: Not on file  . Highest education level: Not on file  Occupational History  . Not on file  Social Needs  . Financial resource strain: Not on file  . Food insecurity    Worry: Not on file    Inability: Not on file  . Transportation needs    Medical: Not on file    Non-medical: Not on file  Tobacco Use  . Smoking status: Current Some Day Smoker    Years: 13.00    Types: Cigars  . Smokeless tobacco: Never Used  Substance and Sexual Activity  . Alcohol use: No  . Drug use: No  . Sexual activity: Not on  file  Lifestyle  . Physical activity    Days per week: Not on file    Minutes per session: Not on file  . Stress: Not on file  Relationships  . Social Herbalist on phone: Not on file    Gets together: Not on file    Attends religious service: Not on file    Active member of club or organization: Not on file    Attends meetings of clubs or organizations: Not on file    Relationship status: Not on file  Other Topics Concern  . Not on file  Social History Narrative  . Not on file   Past Surgical History:  Procedure Laterality Date  . WISDOM TOOTH EXTRACTION     Past Medical History:  Diagnosis Date  . Chronic back pain   . Hypertension    Temp 98.7 F (37.1 C)   Ht 5\' 7"  (1.702 m)   Wt 204 lb 12.8 oz (92.9 kg)   BMI 32.08 kg/m   Opioid Risk Score:   Fall Risk Score:  `1  Depression screen PHQ 2/9  No flowsheet data found.  Review of Systems  Constitutional: Negative.   HENT: Negative.  Eyes: Negative.   Respiratory: Negative.   Cardiovascular: Negative.   Gastrointestinal: Negative.   Endocrine: Negative.   Genitourinary: Negative.   Musculoskeletal: Positive for back pain.  Skin: Negative.   Allergic/Immunologic: Negative.   Neurological: Negative.   Hematological: Negative.   Psychiatric/Behavioral: Negative.   All other systems reviewed and are negative.      Objective:   Physical Exam Vitals signs and nursing note reviewed.  Constitutional:      Appearance: Normal appearance.  HENT:     Head: Normocephalic and atraumatic.  Eyes:     Extraocular Movements: Extraocular movements intact.     Conjunctiva/sclera: Conjunctivae normal.     Pupils: Pupils are equal, round, and reactive to light.  Musculoskeletal:     Comments: Tenderness palpation as well as muscular spasm left paraspinal lumbar area from L2 through L4  Neurological:     General: No focal deficit present.     Mental Status: He is alert and oriented to person, place, and  time. Mental status is at baseline.     Deep Tendon Reflexes:     Reflex Scores:      Patellar reflexes are 2+ on the right side and 2+ on the left side.      Achilles reflexes are 2+ on the right side and 2+ on the left side.    Comments: Negative straight leg raising test bilaterally Motor strength is 5/5 bilateral hip flexor knee extensor ankle dorsiflexor. Tone is normal Gait is without evidence of toe drag or knee instability   Psychiatric:        Mood and Affect: Mood normal.        Behavior: Behavior normal.        Thought Content: Thought content normal.           Assessment & Plan:  #[id number].  28 year old male with chronic left-sided low back pain he did not have any significant relief with L1 L2-L3-L4 medial branch blocks on the left side approximately 1 year ago.  I do not think there is any reason to repeat.  This appears to be mainly muscular spasm and will prescribe tizanidine 2 mg 3 times a day.  I also gave him stretching and strengthening exercises for his low back.  We discussed that this is important to do on a daily basis. I will see him back in approximately 2 months to follow-up on his progress we may do some trigger point injections at that time

## 2018-12-16 NOTE — Patient Instructions (Signed)

## 2019-01-23 ENCOUNTER — Encounter: Payer: Medicaid Other | Attending: Physical Medicine & Rehabilitation | Admitting: Physical Medicine & Rehabilitation

## 2019-01-23 DIAGNOSIS — M545 Low back pain: Secondary | ICD-10-CM | POA: Insufficient documentation

## 2019-01-23 DIAGNOSIS — G8929 Other chronic pain: Secondary | ICD-10-CM | POA: Insufficient documentation

## 2019-01-23 DIAGNOSIS — I10 Essential (primary) hypertension: Secondary | ICD-10-CM | POA: Insufficient documentation

## 2019-01-23 DIAGNOSIS — F17290 Nicotine dependence, other tobacco product, uncomplicated: Secondary | ICD-10-CM | POA: Insufficient documentation
